# Patient Record
Sex: Female | Born: 2000 | Race: Black or African American | Hispanic: No | Marital: Single | Smoking: Never smoker
Health system: Southern US, Community
[De-identification: ages and names within clinical notes are randomized; demographics above are authoritative.]

## PROBLEM LIST (undated history)

## (undated) DIAGNOSIS — Z789 Other specified health status: Secondary | ICD-10-CM

## (undated) HISTORY — PX: NO PAST SURGERIES: SHX2092

## (undated) HISTORY — DX: Other specified health status: Z78.9

---

## 2020-06-06 ENCOUNTER — Encounter (HOSPITAL_COMMUNITY): Payer: Self-pay | Admitting: Emergency Medicine

## 2020-06-06 ENCOUNTER — Other Ambulatory Visit: Payer: Self-pay

## 2020-06-06 ENCOUNTER — Emergency Department (HOSPITAL_COMMUNITY)
Admission: EM | Admit: 2020-06-06 | Discharge: 2020-06-06 | Disposition: A | Discharge status: Home / Self Care | Payer: BC Managed Care – PPO | Attending: Emergency Medicine | Admitting: Emergency Medicine

## 2020-06-06 DIAGNOSIS — R059 Cough, unspecified: Secondary | ICD-10-CM | POA: Insufficient documentation

## 2020-06-06 DIAGNOSIS — J029 Acute pharyngitis, unspecified: Secondary | ICD-10-CM | POA: Diagnosis present

## 2020-06-06 DIAGNOSIS — J020 Streptococcal pharyngitis: Secondary | ICD-10-CM | POA: Diagnosis not present

## 2020-06-06 DIAGNOSIS — R1310 Dysphagia, unspecified: Secondary | ICD-10-CM | POA: Insufficient documentation

## 2020-06-06 DIAGNOSIS — Z20822 Contact with and (suspected) exposure to covid-19: Secondary | ICD-10-CM | POA: Diagnosis not present

## 2020-06-06 DIAGNOSIS — N939 Abnormal uterine and vaginal bleeding, unspecified: Secondary | ICD-10-CM | POA: Insufficient documentation

## 2020-06-06 LAB — RESP PANEL BY RT-PCR (FLU A&B, COVID) ARPGX2
Influenza A by PCR: NEGATIVE
Influenza B by PCR: NEGATIVE
SARS Coronavirus 2 by RT PCR: NEGATIVE

## 2020-06-06 LAB — I-STAT BETA HCG BLOOD, ED (MC, WL, AP ONLY): I-stat hCG, quantitative: <5 m[IU]/mL (ref <5)

## 2020-06-06 LAB — GROUP A STREP BY PCR: Group A Strep by PCR: DETECTED — AB

## 2020-06-06 MED ORDER — PENICILLIN G BENZATHINE 1200000 UNIT/2ML IM SUSP
1.2000 10*6.[IU] | Freq: Once | INTRAMUSCULAR | Status: AC
  Administered 2020-06-06: 1.2 10*6.[IU] via INTRAMUSCULAR
  Filled 2020-06-06: qty 2

## 2020-06-06 NOTE — ED Triage Notes (Signed)
Pt to ED with c/o sore throat and productive cough x's 2 days  Pt also c/o vag bleeding but st's she is not suppose to be on her period until next week

## 2020-06-06 NOTE — ED Provider Notes (Signed)
[facility] EMERGENCY DEPARTMENT Provider Note   CSN: [id number] Arrival date & time: 06/06/20  0020     History Chief Complaint  Patient presents with  . Sore Throat    Angelica Mills is a 19 y.o. female with no significant past medical history who presents the emergency department with a chief complaint of sore throat.  The patient reports that she developed a bilateral sore throat and productive cough with clear sputum 2 days ago.  She reports that she had a tactile fever 2 nights ago, but this is since resolved.  She is endorsing dysphagia, but denies drooling, muffled voice, neck or facial swelling, neck stiffness, headache, nausea, vomiting, diarrhea, abdominal pain.  She has been able to eat and drink at her baseline, but she does have some discomfort with swallowing.  She does also note that her last menstrual cycle ended on November 6, but she noticed a small amount of vaginal bleeding that was only present on the toilet tissue for the last couple of days.  She is unsure if she could be pregnant.  She denies vaginal discharge, pelvic pain, back pain, flank pain, dysuria, urinary frequency or hesitancy.  No treatment prior to arrival.  No known sick contacts.  She is fully vaccinated against COVID-19 and has also previously been infected with COVID-19.  She has no concerns for STIs.  The history is provided by the patient and medical records. No language interpreter was used.       History reviewed. No pertinent past medical history.  There are no problems to display for this patient.   History reviewed. No pertinent surgical history.   OB History   No obstetric history on file.     No family history on file.  Social History   Tobacco Use  . Smoking status: Never Smoker  . Smokeless tobacco: Never Used  Substance Use Topics  . Alcohol use: Yes    Comment: rare  . Drug use: Never    Home Medications Prior to Admission medications     Not on File    Allergies    Patient has no allergy information on record.  Review of Systems   Review of Systems  Constitutional: Positive for fever. Negative for activity change and chills.  HENT: Positive for sore throat and trouble swallowing. Negative for congestion, ear pain, hearing loss, sinus pressure, sinus pain and voice change.   Eyes: Negative for visual disturbance.  Respiratory: Negative for shortness of breath and wheezing.   Cardiovascular: Negative for chest pain and palpitations.  Gastrointestinal: Negative for abdominal pain, constipation, diarrhea, nausea and vomiting.  Genitourinary: Positive for vaginal bleeding. Negative for dysuria, frequency, pelvic pain, urgency, vaginal discharge and vaginal pain.  Musculoskeletal: Negative for back pain, myalgias, neck pain and neck stiffness.  Skin: Negative for rash.  Allergic/Immunologic: Negative for immunocompromised state.  Neurological: Negative for dizziness, seizures, syncope, weakness, numbness and headaches.  Psychiatric/Behavioral: Negative for confusion and dysphoric mood.    Physical Exam Updated Vital Signs BP 128/72 (BP Location: Right Arm)   Pulse 82   Temp 98.6 F (37 C) (Oral)   Resp 16   Ht 5\' 8"  (1.727 m)   Wt 77.6 kg   LMP 05/12/2020 (Exact Date)   SpO2 99%   BMI 26.00 kg/m   Physical Exam Vitals and nursing note reviewed.  Constitutional:      General: She is not in acute distress.    Appearance: She is not ill-appearing, toxic-appearing  or diaphoretic.  HENT:     Head: Normocephalic.     Nose: Nose normal. No congestion or rhinorrhea.     Mouth/Throat:     Mouth: Mucous membranes are moist.     Pharynx: Uvula midline. Posterior oropharyngeal erythema present. No pharyngeal swelling, oropharyngeal exudate or uvula swelling.     Tonsils: No tonsillar exudate or tonsillar abscesses.     Comments: Posterior oropharynx is erythematous.  Tonsils are 1+ bilaterally.  No exudate.  No  peritonsillar abscess.  Tolerating secretions without difficulty.  Phonation is normal.  Uvula is midline. No sublingual edema. Eyes:     General: No scleral icterus.    Conjunctiva/sclera: Conjunctivae normal.  Cardiovascular:     Rate and Rhythm: Normal rate and regular rhythm.     Pulses: Normal pulses.     Heart sounds: Normal heart sounds. No murmur heard.  No friction rub. No gallop.   Pulmonary:     Effort: Pulmonary effort is normal. No respiratory distress.     Breath sounds: No stridor. No wheezing, rhonchi or rales.  Chest:     Chest wall: No tenderness.  Abdominal:     General: There is no distension.     Palpations: Abdomen is soft.     Tenderness: There is no abdominal tenderness.  Musculoskeletal:     Cervical back: Neck supple.  Skin:    General: Skin is warm.     Findings: No rash.  Neurological:     Mental Status: She is alert.  Psychiatric:        Behavior: Behavior normal.     ED Results / Procedures / Treatments   Labs (all labs ordered are listed, but only abnormal results are displayed) Labs Reviewed  GROUP A STREP BY PCR - Abnormal; Notable for the following components:      Result Value   Group A Strep by PCR DETECTED (*)    All other components within normal limits  RESP PANEL BY RT-PCR (FLU A&B, COVID) ARPGX2  I-STAT BETA HCG BLOOD, ED ([facility], [facility], [facility] ONLY)    EKG None  Radiology No results found.  Procedures Procedures (including critical care time)  Medications Ordered in ED Medications  penicillin g benzathine (BICILLIN LA) 1200000 UNIT/2ML injection 1.2 Million Units (1.2 Million Units Intramuscular Given 06/06/20 0517)    ED Course  I have reviewed the triage vital signs and the nursing notes.  Pertinent labs & imaging results that were available during my care of the patient were reviewed by me and considered in my medical decision making (see chart for details).    MDM Rules/Calculators/A&P                           19 year old female who is otherwise healthy presenting with 2 days of sore throat, dysphagia, and productive cough with clear sputum.  She had a tactile fever initially, but this has since resolved.  On exam, there is erythema noted to the posterior oropharynx.  No exudates.  Tonsils are 1+ bilaterally.  I have low suspicion for retropharyngeal abscess or peritonsillar abscess.  Doubt Ludwig's angina.   Labs have been reviewed and independently interpreted by me.  Strep PCR is positive. Doubt COVID-19 or influenza as these test are negative. After shared decision-making conversation, patient is agreeable to IM penicillin G, which has been administered in the ER.  Home supportive care and infectious precautions have been discussed.   In regard to  vaginal bleeding, she has not been passing any clots.  She is having no signs or symptoms of symptomatic anemia.  She has only noticed a small amount of blood on toilet tissue.  Pregnancy test is negative.  Doubt ectopic pregnancy, ruptured ovarian cyst, miscarriage, PID. We discussed performing a pelvic exam in the ER, but she declined.  She will follow up with her OB/GYN if symptoms persist.  ER return precautions given.  She is hemodynamically stable no acute distress.  Safe discharge home with outpatient follow-up as indicated.  Final Clinical Impression(s) / ED Diagnoses Final diagnoses:  Streptococcal pharyngitis  Vaginal spotting    Rx / DC Orders ED Discharge Orders    None       Barkley Boards, PA-C 06/06/20 0708    Mesner, Barbara Cower, MD 06/06/20 [phone]

## 2020-06-06 NOTE — Discharge Instructions (Addendum)
Thank you for allowing me to care for you today in the Emergency Department.   You tested positive for strep throat today/streptococcal pharyngitis.  You were treated with an injection of penicillin.  You can remain contagious from strep throat for 1 to 2 days after treatment.  You should avoid sharing food utensils, drinking after anyone else.   Make sure that you are frequently washing her hands with warm water and soap.  Cover your mouth when you cough or sneeze.  If any close contacts develop similar symptoms, they should also seek testing.  Eating and drinking hot and cold beverages and food may be easier to swallow.  You can gargle warm salt water every 6 hours to help with your pain.  Take 650 mg of Tylenol or 600 mg of ibuprofen with food every 6 hours for pain.  You can alternate between these 2 medications every 3 hours if your pain returns.  For instance, you can take Tylenol at noon, followed by a dose of ibuprofen at 3, followed by second dose of Tylenol and 6.  Regarding vaginal bleeding, you can follow-up with your OB/GYN if you continue to have irregular vaginal bleeding.  You should return to the emergency department if you have vaginal bleeding after having a positive pregnancy test, if you have heavy vaginal bleeding or you are passing lots of clots, if you feel very short of breath, if you pass out, or develop other new, concerning symptoms

## 2020-07-07 NOTE — L&D Delivery Note (Signed)
[facility] Delivery Note  Angelica Mills is a 20 y.o. G1P0 s/p induced vag del at [redacted]w[redacted]d. She was admitted for IOL due to gHTN.   ROM: 5h 76m with clear fluid GBS Status: pos Maximum Maternal Temperature: 99.1  Labor Progress: Angelica Mills was admitted for IOL due to gHTN. She had cytotec dosing and a Cook's catheter place, followed by AROM and then progression to complete dilation. Her BPs were mildly elevated in labor and she had a neg P/C ratio.  Delivery Date/Time: February 26, 2021 at 1235pm Delivery: Called to room and patient was complete and pushing. Head delivered OA. No nuchal cord present. Shoulder and body delivered in usual fashion. Infant with spontaneous cry, placed on mother's abdomen, dried and stimulated. Cord clamped x 2 after 1-minute delay, and cut by FOB. Cord blood drawn. Placenta delivered spontaneously with gentle cord traction. Fundus firm with massage and Pitocin. Labia, perineum, vagina, and cervix inspected and found to have a L labial abrasion, not repaired.   Placenta: spont, intact; to L&D Complications: none Lacerations: none EBL: 100cc Analgesia: none  Postpartum Planning [x]  message to sent to schedule follow-up   Infant: girl  APGARs 9/9  2784g (6lb 2.2oz)  11/9, CNM  02/26/2021 1:25 PM

## 2020-08-22 ENCOUNTER — Ambulatory Visit (INDEPENDENT_AMBULATORY_CARE_PROVIDER_SITE_OTHER): Payer: BC Managed Care – PPO | Admitting: Obstetrics and Gynecology

## 2020-08-22 ENCOUNTER — Encounter: Payer: Self-pay | Admitting: Obstetrics and Gynecology

## 2020-08-22 ENCOUNTER — Other Ambulatory Visit (HOSPITAL_COMMUNITY)
Admission: RE | Admit: 2020-08-22 | Discharge: 2020-08-22 | Disposition: A | Discharge status: Home / Self Care | Payer: BC Managed Care – PPO | Source: Ambulatory Visit | Attending: Obstetrics and Gynecology | Admitting: Obstetrics and Gynecology

## 2020-08-22 ENCOUNTER — Other Ambulatory Visit: Payer: Self-pay

## 2020-08-22 VITALS — BP 117/78 | HR 98 | Temp 97.9°F | Wt 191.8 lb

## 2020-08-22 DIAGNOSIS — B9689 Other specified bacterial agents as the cause of diseases classified elsewhere: Secondary | ICD-10-CM | POA: Diagnosis not present

## 2020-08-22 DIAGNOSIS — O0990 Supervision of high risk pregnancy, unspecified, unspecified trimester: Secondary | ICD-10-CM | POA: Insufficient documentation

## 2020-08-22 DIAGNOSIS — Z3A11 11 weeks gestation of pregnancy: Secondary | ICD-10-CM | POA: Diagnosis not present

## 2020-08-22 DIAGNOSIS — Z3401 Encounter for supervision of normal first pregnancy, first trimester: Secondary | ICD-10-CM | POA: Diagnosis present

## 2020-08-22 DIAGNOSIS — Z3400 Encounter for supervision of normal first pregnancy, unspecified trimester: Secondary | ICD-10-CM | POA: Diagnosis present

## 2020-08-22 DIAGNOSIS — O23591 Infection of other part of genital tract in pregnancy, first trimester: Secondary | ICD-10-CM | POA: Diagnosis not present

## 2020-08-22 HISTORY — DX: Supervision of high risk pregnancy, unspecified, unspecified trimester: O09.90

## 2020-08-22 NOTE — Progress Notes (Addendum)
INITIAL OBSTETRICAL VISIT Patient name: Angelica Mills MRN [id number]  Date of birth: August 11, 2000 Chief Complaint:   Initial Prenatal Visit  History of Present Illness:   Angelica Mills is a 20 y.o. G1P0 African American female at [redacted]w[redacted]d by LMP with an Estimated Date of Delivery: 03/10/21 being seen today for her initial obstetrical visit.  Her obstetrical history is significant for none. This is a planned pregnancy. She and the father of the baby (FOB)/spouse "[name]" live together. She has a support system that consists of her spouseher mother/father/friends. Today she reports nausea and vomiting. She reports the N/V had stopped, but this morning she threw up her water and the nuts she tried to eat. She reports eating a vegetarian diet. She desires a low intervention birth/waterbirth.  Patient's last menstrual period was 06/03/2020. Last pap n/a. Results were: n/a Review of Systems:   Pertinent items are noted in HPI Denies cramping/contractions, leakage of fluid, vaginal bleeding, abnormal vaginal discharge w/ itching/odor/irritation, headaches, visual changes, shortness of breath, chest pain, abdominal pain, severe nausea/vomiting, or problems with urination or bowel movements unless otherwise stated above.  Pertinent History Reviewed:  Reviewed past medical,surgical, social, obstetrical and family history.  Reviewed problem list, medications and allergies. OB History  Gravida Para Term Preterm AB Living  1            SAB IAB Ectopic Multiple Live Births               # Outcome Date GA Lbr Len/2nd Weight Sex Delivery Anes PTL Lv  1 Current            Physical Assessment:   Vitals:   08/22/20 0922  BP: 117/78  Pulse: 98  Temp: 97.9 F (36.6 C)  Weight: 191 lb 12.8 oz (87 kg)  Body mass index is 29.16 kg/m.       Physical Examination:  General appearance - well appearing, and in no distress  Mental status - alert, oriented to person, place, and time  Psych:  She  has a normal mood and affect  Skin - warm and dry, normal color, no suspicious lesions noted  Chest - effort normal, all lung fields clear to auscultation bilaterally  Heart - normal rate and regular rhythm  Abdomen - soft, nontender  Extremities:  No swelling or varicosities noted  Pelvic - VULVA: normal appearing vulva with no masses, tenderness or lesions  VAGINA: normal appearing vagina with normal color and discharge, no lesions.   CERVIX: normal appearing cervix without discharge or lesions, no CMT  Thin prep pap is not done   FHTs by doppler: 160 bpm  Assessment & Plan:  1) Low-Risk Pregnancy G1P0 at [redacted]w[redacted]d with an Estimated Date of Delivery: 03/10/21   2) Initial OB visit - Welcomed to practice and introduced self to patient in addition to discussing other advanced practice providers that she may be seeing at this practice - Congratulated patient - Anticipatory guidance on upcoming appointments - Educated on COVID19 and pregnancy and the integration of virtual appointments  - Educated on babyscripts app- patient reports she has not received email, encouraged to look in spam folder and to call office if she still has not received email - patient verbalizes understanding    3) Supervision of normal first pregnancy, antepartum - CBC/D/Plt+RPR+Rh+ABO+Rub Ab... - Culture, OB Urine - Cervicovaginal ancillary only( [place]) - Genetic Screening - Hemoglobin A1c - Information provided on 1st and 2nd trimesters and waterbirth - Anatomy U/S @  18 wks    Meds: No orders of the defined types were placed in this encounter.   Initial labs obtained Continue prenatal vitamins Reviewed n/v relief measures and warning s/s to report Reviewed recommended weight gain based on pre-gravid BMI Encouraged well-balanced diet Genetic Screening discussed: ordered Cystic fibrosis, SMA, Fragile X screening discussed ordered The nature of [facility] with multiple  MDs and other Advanced Practice Providers was explained to patient; also emphasized that residents, students are part of our team.  Discussed optimized OB schedule and video visits. Advised can have an in-office visit whenever she feels she needs to be seen.  Does not have own BP cuff. BP cuff given today. Explained to patient that BP will be mailed to her house. Check BP weekly, let us know if >140/90. Advised to call during normal business hours and there is an after-hours nurse line available.    Follow-up: Return in about 6 weeks (around 10/03/2020) for Return OB - My Chart video.   Orders Placed This Encounter  Procedures  . Culture, OB Urine  . [name] MFM OB COMP + 14 WK  . CBC/D/Plt+RPR+Rh+ABO+Rub Ab...  . Genetic Screening  . Hemoglobin A1c    [name], CNM 08/22/2020

## 2020-08-22 NOTE — Patient Instructions (Addendum)
First Trimester of Pregnancy  The first trimester of pregnancy starts on the first day of your last menstrual period until the end of week 12. This is months 1 through 3 of pregnancy. A week after a sperm fertilizes an egg, the egg will implant into the wall of the uterus and begin to develop into a baby. By the end of 12 weeks, all the baby's organs will be formed and the baby will be 2-3 inches in size. Body changes during your first trimester Your body goes through many changes during pregnancy. The changes vary and generally return to normal after your baby is born. Physical changes  You may gain or lose weight.  Your breasts may begin to grow larger and become tender. The tissue that surrounds your nipples (areola) may become darker.  Dark spots or blotches (chloasma or mask of pregnancy) may develop on your face.  You may have changes in your hair. These can include thickening or thinning of your hair or changes in texture. Health changes  You may feel nauseous, and you may vomit.  You may have heartburn.  You may develop headaches.  You may develop constipation.  Your gums may bleed and may be sensitive to brushing and flossing. Other changes  You may tire easily.  You may urinate more often.  Your menstrual periods will stop.  You may have a loss of appetite.  You may develop cravings for certain kinds of food.  You may have changes in your emotions from day to day.  You may have more vivid and strange dreams. Follow these instructions at home: Medicines  Follow your health care provider's instructions regarding medicine use. Specific medicines may be either safe or unsafe to take during pregnancy. Do not take any medicines unless told to by your health care provider.  Take a prenatal vitamin that contains at least 600 micrograms (mcg) of folic acid. Eating and drinking  Eat a healthy diet that includes fresh fruits and vegetables, whole grains, good sources of  protein such as meat, eggs, or tofu, and low-fat dairy products.  Avoid raw meat and unpasteurized juice, milk, and cheese. These carry germs that can harm you and your baby.  If you feel nauseous or you vomit: ? Eat 4 or 5 small meals a day instead of 3 large meals. ? Try eating a few soda crackers. ? Drink liquids between meals instead of during meals.  You may need to take these actions to prevent or treat constipation: ? Drink enough fluid to keep your urine pale yellow. ? Eat foods that are high in fiber, such as beans, whole grains, and fresh fruits and vegetables. ? Limit foods that are high in fat and processed sugars, such as fried or sweet foods. Activity  Exercise only as directed by your health care provider. Most people can continue their usual exercise routine during pregnancy. Try to exercise for 30 minutes at least 5 days a week.  Stop exercising if you develop pain or cramping in the lower abdomen or lower back.  Avoid exercising if it is very hot or humid or if you are at high altitude.  Avoid heavy lifting.  If you choose to, you may have sex unless your health care provider tells you not to. Relieving pain and discomfort  Wear a good support bra to relieve breast tenderness.  Rest with your legs elevated if you have leg cramps or low back pain.  If you develop bulging veins (varicose veins) in   your legs: ? Wear support hose as told by your health care provider. ? Elevate your feet for 15 minutes, 3-4 times a day. ? Limit salt in your diet. Safety  Wear your seat belt at all times when driving or riding in a car.  Talk with your health care provider if someone is verbally or physically abusive to you.  Talk with your health care provider if you are feeling sad or have thoughts of hurting yourself. Lifestyle  Do not use hot tubs, steam rooms, or saunas.  Do not douche. Do not use tampons or scented sanitary pads.  Do not use herbal remedies, alcohol,  illegal drugs, or medicines that are not approved by your health care provider. Chemicals in these products can harm your baby.  Do not use any products that contain nicotine or tobacco, such as cigarettes, e-cigarettes, and chewing tobacco. If you need help quitting, ask your health care provider.  Avoid cat litter boxes and soil used by cats. These carry germs that can cause birth defects in the baby and possibly loss of the unborn baby (fetus) by miscarriage or stillbirth. General instructions  During routine prenatal visits in the first trimester, your health care provider will do a physical exam, perform necessary tests, and ask you how things are going. Keep all follow-up visits. This is important.  Ask for help if you have counseling or nutritional needs during pregnancy. Your health care provider can offer advice or refer you to specialists for help with various needs.  Schedule a dentist appointment. At home, brush your teeth with a soft toothbrush. Floss gently.  Write down your questions. Take them to your prenatal visits. Where to find more information  American Pregnancy Association: americanpregnancy.org  [employer] of [employer]st[employer]ians and Gynecologists: https://www.todd-brady.net/  Office on [employer] Health: MightyReward.co.nz Contact a health care provider if you have:  Dizziness.  A fever.  Mild pelvic cramps, pelvic pressure, or nagging pain in the abdominal area.  Nausea, vomiting, or diarrhea that lasts for 24 hours or longer.  A bad-smelling vaginal discharge.  Pain when you urinate.  Known exposure to a contagious illness, such as chickenpox, measles, Zika virus, HIV, or hepatitis. Get help right away if you have:  Spotting or bleeding from your vagina.  Severe abdominal cramping or pain.  Shortness of breath or chest pain.  Any kind of trauma, such as from a fall or a car crash.  New or increased pain, swelling, or redness in an  arm or leg. Summary  The first trimester of pregnancy starts on the first day of your last menstrual period until the end of week 12 (months 1 through 3).  Eating 4 or 5 small meals a day rather than 3 large meals may help to relieve nausea and vomiting.  Do not use any products that contain nicotine or tobacco, such as cigarettes, e-cigarettes, and chewing tobacco. If you need help quitting, ask your health care provider.  Keep all follow-up visits. This is important. This information is not intended to replace advice given to you by your health care provider. Make sure you discuss any questions you have with your health care provider. Document Revised: 11/30/2019 Document Reviewed: 10/06/2019 Elsevier Patient Education  2021 Elsevier Inc.   Second Trimester of Pregnancy  The second trimester of pregnancy is from week 13 through week 27. This is months 4 through 6 of pregnancy. The second trimester is often a time when you feel your best. Your body has adjusted to  being pregnant, and you begin to feel better physically. During the second trimester:  Morning sickness has lessened or stopped completely.  You may have more energy.  You may have an increase in appetite. The second trimester is also a time when the unborn baby (fetus) is growing rapidly. At the end of the sixth month, the fetus may be up to 12 inches long and weigh about 1 pounds. You will likely begin to feel the baby move (quickening) between 16 and 20 weeks of pregnancy. Body changes during your second trimester Your body continues to go through many changes during your second trimester. The changes vary and generally return to normal after the baby is born. Physical changes  Your weight will continue to increase. You will notice your lower abdomen bulging out.  You may begin to get stretch marks on your hips, abdomen, and breasts.  Your breasts will continue to grow and to become tender.  Dark spots or blotches  (chloasma or mask of pregnancy) may develop on your face.  A dark line from your belly button to the pubic area (linea nigra) may appear.  You may have changes in your hair. These can include thickening of your hair, rapid growth, and changes in texture. Some people also have hair loss during or after pregnancy, or hair that feels dry or thin. Health changes  You may develop headaches.  You may have heartburn.  You may develop constipation.  You may develop hemorrhoids or swollen, bulging veins (varicose veins).  Your gums may bleed and may be sensitive to brushing and flossing.  You may urinate more often because the fetus is pressing on your bladder.  You may have back pain. This is caused by: ? Weight gain. ? Pregnancy hormones that are relaxing the joints in your pelvis. ? A shift in weight and the muscles that support your balance. Follow these instructions at home: Medicines  Follow your health care provider's instructions regarding medicine use. Specific medicines may be either safe or unsafe to take during pregnancy. Do not take any medicines unless approved by your health care provider.  Take a prenatal vitamin that contains at least 600 micrograms (mcg) of folic acid. Eating and drinking  Eat a healthy diet that includes fresh fruits and vegetables, whole grains, good sources of protein such as meat, eggs, or tofu, and low-fat dairy products.  Avoid raw meat and unpasteurized juice, milk, and cheese. These carry germs that can harm you and your baby.  You may need to take these actions to prevent or treat constipation: ? Drink enough fluid to keep your urine pale yellow. ? Eat foods that are high in fiber, such as beans, whole grains, and fresh fruits and vegetables. ? Limit foods that are high in fat and processed sugars, such as fried or sweet foods. Activity  Exercise only as directed by your health care provider. Most people can continue their usual exercise  routine during pregnancy. Try to exercise for 30 minutes at least 5 days a week. Stop exercising if you develop contractions in your uterus.  Stop exercising if you develop pain or cramping in the lower abdomen or lower back.  Avoid exercising if it is very hot or humid or if you are at a high altitude.  Avoid heavy lifting.  If you choose to, you may have sex unless your health care provider tells you not to. Relieving pain and discomfort  Wear a supportive bra to prevent discomfort from breast tenderness.  Take warm sitz baths to soothe any pain or discomfort caused by hemorrhoids. Use hemorrhoid cream if your health care provider approves.  Rest with your legs raised (elevated) if you have leg cramps or low back pain.  If you develop varicose veins: ? Wear support hose as told by your health care provider. ? Elevate your feet for 15 minutes, 3-4 times a day. ? Limit salt in your diet. Safety  Wear your seat belt at all times when driving or riding in a car.  Talk with your health care provider if someone is verbally or physically abusive to you. Lifestyle  Do not use hot tubs, steam rooms, or saunas.  Do not douche. Do not use tampons or scented sanitary pads.  Avoid cat litter boxes and soil used by cats. These carry germs that can cause birth defects in the baby and possibly loss of the fetus by miscarriage or stillbirth.  Do not use herbal remedies, alcohol, illegal drugs, or medicines that are not approved by your health care provider. Chemicals in these products can harm your baby.  Do not use any products that contain nicotine or tobacco, such as cigarettes, e-cigarettes, and chewing tobacco. If you need help quitting, ask your health care provider. General instructions  During a routine prenatal visit, your health care provider will do a physical exam and other tests. He or she will also discuss your overall health. Keep all follow-up visits. This is important.  Ask  your health care provider for a referral to a local prenatal education class.  Ask for help if you have counseling or nutritional needs during pregnancy. Your health care provider can offer advice or refer you to specialists for help with various needs. Where to find more information  American Pregnancy Association: americanpregnancy.org  [employer]stetricians and Gynecologists: https://www.todd-brady.net/  Office on [employer] Health: MightyReward.co.nz Contact a health care provider if you have:  A headache that does not go away when you take medicine.  Vision changes or you see spots in front of your eyes.  Mild pelvic cramps, pelvic pressure, or nagging pain in the abdominal area.  Persistent nausea, vomiting, or diarrhea.  A bad-smelling vaginal discharge or foul-smelling urine.  Pain when you urinate.  Sudden or extreme swelling of your face, hands, ankles, feet, or legs.  A fever. Get help right away if you:  Have fluid leaking from your vagina.  Have spotting or bleeding from your vagina.  Have severe abdominal cramping or pain.  Have difficulty breathing.  Have chest pain.  Have fainting spells.  Have not felt your baby move for the time period told by your health care provider.  Have new or increased pain, swelling, or redness in an arm or leg. Summary  The second trimester of pregnancy is from week 13 through week 27 (months 4 through 6).  Do not use herbal remedies, alcohol, illegal drugs, or medicines that are not approved by your health care provider. Chemicals in these products can harm your baby.  Exercise only as directed by your health care provider. Most people can continue their usual exercise routine during pregnancy.  Keep all follow-up visits. This is important. This information is not intended to replace advice given to you by your health care provider. Make sure you discuss any questions you have with your health  care provider. Document Revised: 11/30/2019 Document Reviewed: 10/06/2019 Elsevier Patient Education  2021 [employer].   Considering [place]? Guide for patients at [facility] [employer] [facility] (  [facility]) Why consider waterbirth? . Gentle birth for babies  . Less pain medicine used in labor  . May allow for passive descent/less pushing  . May reduce perineal tears  . More mobility and instinctive maternal position changes  . Increased maternal relaxation   Is waterbirth safe? What are the risks of infection, drowning or other complications? . Infection:  Marland Kitchen. Very low risk (3.7 % for tub vs 4.8% for bed)  . 7 in 8000 waterbirths with documented infection  . Poorly cleaned equipment most common cause  . Slightly lower group B strep transmission rate  . Drowning  . Maternal:  . Very low risk  . Related to seizures or fainting  . Newborn:  Marland Kitchen. Very low risk. No evidence of increased risk of respiratory problems in multiple large studies  . Physiological protection from breathing under water  . Avoid underwater birth if there are any fetal complications  . Once baby's head is out of the water, keep it out.  . Birth complication  . Some reports of cord trauma, but risk decreased by bringing baby to surface gradually  . No evidence of increased risk of shoulder dystocia. Mothers can usually change positions faster in water than in a bed, possibly aiding the maneuvers to free the shoulder.   There are 2 things you MUST do to have a waterbirth with [facility]: 1. Attend a [facility] class [facility]   a. 3rd Wednesday of every month from 7-9 pm (virtual during COVID) b. Free c. Register by calling [phone] or register online at [email or web address] d. Bring us the certificate from the class to your prenatal appointment or send via MyChart 2. Meet with a midwife at 36 weeks* to see if you can still plan a waterbirth and to sign the consent.   *We also  recommend that you schedule as many of your prenatal visits with a midwife as possible.    Helpful information: . You may want to bring a bathing suit top to the hospital to wear during labor but this is optional.  All other supplies are provided by the hospital. . Please arrive at the hospital with signs of active labor, and do not wait at home until late in labor. It takes 45 min- 2 hours for COVID testing, fetal monitoring, and check in to your room to take place, plus transport and filling of the waterbirth tub.    Things that would prevent you from having a waterbirth: . Unknown or Positive COVID-19 diagnosis upon admission to hospital* . Premature, <37wks  . Previous cesarean birth  . Presence of thick meconium-stained fluid  . Multiple gestation (Twins, triplets, etc.)  . Uncontrolled diabetes or gestational diabetes requiring medication  . Hypertension diagnosed in pregnancy or preexisting hypertension (gestational hypertension, preeclampsia, or chronic hypertension) . Heavy vaginal bleeding  . Non-reassuring fetal heart rate  . Active infection (MRSA, etc.). Group B Strep is NOT a contraindication for waterbirth.  . If your labor has to be induced and induction method requires continuous monitoring of the baby's heart rate  . Other risks/issues identified by your obstetrical provider   Please remember that birth is unpredictable. Under certain unforeseeable circumstances your provider may advise against giving birth in the tub. These decisions will be made on a case-by-case basis and with the safety of you and your baby as our highest priority.   *Please remember that in order to have a waterbirth, you must test Negative to COVID-19  upon admission to the hospital.  Updated 05/22/2020

## 2020-08-23 LAB — CBC/D/PLT+RPR+RH+ABO+RUB AB...
Antibody Screen: NEGATIVE
Basophils Absolute: 0 10*3/uL (ref 0.0–0.2)
Basos: 0 %
EOS (ABSOLUTE): 0 10*3/uL (ref 0.0–0.4)
Eos: 0 %
HCV Ab: <0.1 s/co ratio (ref 0.0–0.9)
HIV Screen 4th Generation wRfx: NONREACTIVE
Hematocrit: 38.6 % (ref 34.0–46.6)
Hemoglobin: 12.9 g/dL (ref 11.1–15.9)
Hepatitis B Surface Ag: NEGATIVE
Immature Grans (Abs): 0 10*3/uL (ref 0.0–0.1)
Immature Granulocytes: 0 %
Lymphocytes Absolute: 1.8 10*3/uL (ref 0.7–3.1)
Lymphs: 25 %
MCH: 28.4 pg (ref 26.6–33.0)
MCHC: 33.4 g/dL (ref 31.5–35.7)
MCV: 85 fL (ref 79–97)
Monocytes Absolute: 0.3 10*3/uL (ref 0.1–0.9)
Monocytes: 5 %
Neutrophils Absolute: 5 10*3/uL (ref 1.4–7.0)
Neutrophils: 70 %
Platelets: 345 10*3/uL (ref 150–450)
RBC: 4.55 x10E6/uL (ref 3.77–5.28)
RDW: 13.3 % (ref 11.7–15.4)
RPR Ser Ql: NONREACTIVE
Rh Factor: POSITIVE
Rubella Antibodies, IGG: 1.31 index (ref >0.99)
WBC: 7.3 10*3/uL (ref 3.4–10.8)

## 2020-08-23 LAB — HEMOGLOBIN A1C
Est. average glucose Bld gHb Est-mCnc: 100 mg/dL
Hgb A1c MFr Bld: 5.1 % (ref 4.8–5.6)

## 2020-08-23 LAB — HCV INTERPRETATION

## 2020-08-24 LAB — CERVICOVAGINAL ANCILLARY ONLY
Bacterial Vaginitis (gardnerella): POSITIVE — AB
Candida Glabrata: NEGATIVE
Candida Vaginitis: NEGATIVE
Chlamydia: NEGATIVE
Comment: NEGATIVE
Comment: NEGATIVE
Comment: NEGATIVE
Comment: NEGATIVE
Comment: NEGATIVE
Comment: NORMAL
Neisseria Gonorrhea: NEGATIVE
Trichomonas: NEGATIVE

## 2020-08-24 LAB — URINE CULTURE, OB REFLEX: Organism ID, Bacteria: NO GROWTH

## 2020-08-24 LAB — CULTURE, OB URINE

## 2020-08-25 ENCOUNTER — Other Ambulatory Visit: Payer: Self-pay | Admitting: Obstetrics and Gynecology

## 2020-08-25 DIAGNOSIS — B9689 Other specified bacterial agents as the cause of diseases classified elsewhere: Secondary | ICD-10-CM

## 2020-08-25 MED ORDER — METRONIDAZOLE 500 MG PO TABS: 500.0000 mg | ORAL_TABLET | Freq: Two times a day (BID) | ORAL | 0 refills | Status: DC

## 2020-08-27 ENCOUNTER — Encounter: Payer: Self-pay | Admitting: *Deleted

## 2020-09-03 ENCOUNTER — Encounter: Payer: Self-pay | Admitting: *Deleted

## 2020-09-24 ENCOUNTER — Encounter: Payer: Self-pay | Admitting: *Deleted

## 2020-10-04 ENCOUNTER — Telehealth (INDEPENDENT_AMBULATORY_CARE_PROVIDER_SITE_OTHER): Payer: BC Managed Care – PPO | Admitting: Obstetrics and Gynecology

## 2020-10-04 ENCOUNTER — Encounter: Payer: Self-pay | Admitting: Obstetrics and Gynecology

## 2020-10-04 ENCOUNTER — Other Ambulatory Visit: Payer: Self-pay

## 2020-10-04 VITALS — BP 117/65 | HR 87 | Wt 194.0 lb

## 2020-10-04 DIAGNOSIS — Z3A17 17 weeks gestation of pregnancy: Secondary | ICD-10-CM | POA: Diagnosis not present

## 2020-10-04 DIAGNOSIS — Z3402 Encounter for supervision of normal first pregnancy, second trimester: Secondary | ICD-10-CM

## 2020-10-04 DIAGNOSIS — Z3400 Encounter for supervision of normal first pregnancy, unspecified trimester: Secondary | ICD-10-CM

## 2020-10-04 NOTE — Addendum Note (Signed)
Addended by: Kenard Gower on: 10/04/2020 03:08 PM   Modules accepted: Level of Service

## 2020-10-04 NOTE — Progress Notes (Signed)
   MY CHART VIDEO VIRTUAL OBSTETRICS VISIT ENCOUNTER NOTE  I connected with Angelica Mills on 10/04/20 at  9:10 AM EDT by My Chart video at home and verified that I am speaking with the correct person using two identifiers. Provider located at [employer] [facility] [employer] at [place].   I discussed the limitations, risks, security and privacy concerns of performing an evaluation and management service by My Chart video and the availability of in person appointments. I also discussed with the patient that there may be a patient responsible charge related to this service. The patient expressed understanding and agreed to proceed.  Subjective:  Angelica Mills is a 20 y.o. G1P0 at [redacted]w[redacted]d being followed for ongoing prenatal care.  She is currently monitored for the following issues for this low-risk pregnancy and has Supervision of normal first pregnancy, antepartum on their problem list.  Patient reports no complaints. Reports fetal movement. Denies any contractions, bleeding or leaking of fluid.   The following portions of the patient's history were reviewed and updated as appropriate: allergies, current medications, past family history, past medical history, past social history, past surgical history and problem list.   Objective:   General:  Alert, oriented and cooperative.   Mental Status: Normal mood and affect perceived. Normal judgment and thought content.  Rest of physical exam deferred due to type of encounter  BP 117/65   Pulse 87   Wt 194 lb (88 kg)   LMP 06/03/2020   BMI 29.50 kg/m  **Done by patient's own at home BP cuff and scale  Assessment and Plan:  Pregnancy: G1P0 at [redacted]w[redacted]d  1. Supervision of normal first pregnancy, antepartum - Demonstrated over MyChart video how to properly use BP cuff. Patient was able to return demonstration and expressed understanding of its use. - Anatomy U/S scheduled for 11/13/20 - Anticipatory guidance for MyChart visit in 6  weeks  2. [redacted] weeks gestation of pregnancy    Preterm labor symptoms and general obstetric precautions including but not limited to vaginal bleeding, contractions, leaking of fluid and fetal movement were reviewed in detail with the patient.  I discussed the assessment and treatment plan with the patient. The patient was provided an opportunity to ask questions and all were answered. The patient agreed with the plan and demonstrated an understanding of the instructions. The patient was advised to call back or seek an in-person office evaluation/go to MAU at [facility] for any urgent or concerning symptoms. Please refer to After Visit Summary for other counseling recommendations.   I provided 10 minutes of non-face-to-face time during this encounter. There was 5 minutes of chart review time spent prior to this encounter. Total time spent = 15 minutes.  Return in about 6 weeks (around 11/15/2020) for Return OB - My Chart video.  Future Appointments  Date Time Provider Department Center  11/13/2020  9:30 AM [facility] NURSE [facility]  11/13/2020  9:45 AM [facility] US5 [facility]    Raelyn Mora, CNM [facility] [employer], [facility]

## 2020-11-11 ENCOUNTER — Inpatient Hospital Stay (HOSPITAL_COMMUNITY)
Admission: AD | Admit: 2020-11-11 | Discharge: 2020-11-11 | Disposition: A | Discharge status: Home / Self Care | Payer: BC Managed Care – PPO | Attending: Obstetrics & Gynecology | Admitting: Obstetrics & Gynecology

## 2020-11-11 ENCOUNTER — Encounter (HOSPITAL_COMMUNITY): Payer: Self-pay | Admitting: Emergency Medicine

## 2020-11-11 ENCOUNTER — Inpatient Hospital Stay (HOSPITAL_BASED_OUTPATIENT_CLINIC_OR_DEPARTMENT_OTHER): Payer: BC Managed Care – PPO

## 2020-11-11 ENCOUNTER — Other Ambulatory Visit: Payer: Self-pay

## 2020-11-11 DIAGNOSIS — O23593 Infection of other part of genital tract in pregnancy, third trimester: Secondary | ICD-10-CM | POA: Diagnosis not present

## 2020-11-11 DIAGNOSIS — B9689 Other specified bacterial agents as the cause of diseases classified elsewhere: Secondary | ICD-10-CM | POA: Diagnosis not present

## 2020-11-11 DIAGNOSIS — O4692 Antepartum hemorrhage, unspecified, second trimester: Secondary | ICD-10-CM | POA: Diagnosis not present

## 2020-11-11 DIAGNOSIS — Z3A23 23 weeks gestation of pregnancy: Secondary | ICD-10-CM | POA: Diagnosis not present

## 2020-11-11 DIAGNOSIS — O23592 Infection of other part of genital tract in pregnancy, second trimester: Secondary | ICD-10-CM | POA: Diagnosis not present

## 2020-11-11 DIAGNOSIS — N760 Acute vaginitis: Secondary | ICD-10-CM | POA: Diagnosis not present

## 2020-11-11 LAB — URINALYSIS, ROUTINE W REFLEX MICROSCOPIC
Bacteria, UA: NONE SEEN
Bilirubin Urine: NEGATIVE
Glucose, UA: 150 mg/dL — AB
Ketones, ur: NEGATIVE mg/dL
Leukocytes,Ua: NEGATIVE
Nitrite: NEGATIVE
Protein, ur: NEGATIVE mg/dL
Specific Gravity, Urine: 1.009 (ref 1.005–1.030)
pH: 7 (ref 5.0–8.0)

## 2020-11-11 LAB — WET PREP, GENITAL
Sperm: NONE SEEN
Trich, Wet Prep: NONE SEEN
Yeast Wet Prep HPF POC: NONE SEEN

## 2020-11-11 MED ORDER — METRONIDAZOLE 500 MG PO TABS: 500.0000 mg | ORAL_TABLET | Freq: Two times a day (BID) | ORAL | 0 refills | Status: DC

## 2020-11-11 NOTE — ED Provider Notes (Signed)
Emergency Medicine Provider OB Triage Evaluation Note  Angelica Mills is a 20 y.o. female, G1P0, at [redacted]w[redacted]d gestation who presents to the emergency department with complaints of vaginal bleeding x 1 day. Just prior to arrival she felt like she needed to have a bowl movement, she did not however noticed bright red blood on the toilet paper and in the toilet. Has had intermittent abdominal cramping. She admits to dizziness that started this AM. Last intercourse x 2 days ago.   Review of  Systems  Positive: vaginal bleeding, abdominal pain,  Negative:  Back pain, fever, chills  Physical Exam  BP 124/68   Pulse 92   Temp 98.9 F (37.2 C) (Oral)   Resp 18   LMP 06/03/2020   SpO2 100%  General: Awake, no distress  HEENT: Atraumatic  Resp: Normal effort  Cardiac: Normal rate Abd: gravid abdomen, nontender  MSK: Moves all extremities without difficulty Neuro: Speech clear  Medical Decision Making  Pt evaluated for pregnancy concern and is stable for transfer to MAU. Pt is in agreement with plan for transfer.  7:57 PM Discussed with MAU APP, Rayfield Citizen, who accepts patient in transfer.  Clinical Impression  Patient hypertensive in triage. Work up deferred to MAU. Patient transported immediately after my exam.     Shanon Ace, PA-C 11/11/20 2313    Mancel Bale, MD 11/12/20 [phone]

## 2020-11-11 NOTE — MAU Note (Signed)
Pt sated she started haveng bleeding like a period about and hour ago and cramping on her side.

## 2020-11-11 NOTE — ED Triage Notes (Signed)
Patient is [redacted] weeks pregnant G1P0, reports vaginal bleeding this evening with abdominal cramping .

## 2020-11-11 NOTE — Discharge Instructions (Signed)
Bacterial Vaginosis  Bacterial vaginosis is an infection of the vagina. It happens when too many normal germs (healthy bacteria) grow in the vagina. This infection can make it easier to get other infections from sex (STIs). It is very important for pregnant women to get treated. This infection can cause babies to be born early or at a low birth weight. What are the causes? This infection is caused by an increase in certain germs that grow in the vagina. You cannot get this infection from toilet seats, bedsheets, swimming pools, or things that touch your vagina. What increases the risk?  Having sex with a new person or more than one person.  Having sex without protection.  Douching.  Having an intrauterine device (IUD).  Smoking.  Using drugs or drinking alcohol. These can lead you to do things that are risky.  Taking certain antibiotic medicines.  Being pregnant. What are the signs or symptoms? Some women have no symptoms. Symptoms may include:  A discharge from your vagina. It may be gray or white. It can be watery or foamy.  A fishy smell. This can happen after sex or during your menstrual period.  Itching in and around your vagina.  A feeling of burning or pain when you pee (urinate). How is this treated? This infection is treated with antibiotic medicines. These may be given to you as:  A pill.  A cream for your vagina.  A medicine that you put into your vagina (suppository). If the infection comes back after treatment, you may need more antibiotics. Follow these instructions at home: Medicines  Take over-the-counter and prescription medicines as told by your doctor.  Take or use your antibiotic medicine as told by your doctor. Do not stop taking or using it, even if you start to feel better. General instructions  If the person you have sex with is a woman, tell her that you have this infection. She will need to follow up with her doctor. If you have a female  partner, he does not need to be treated.  Do not have sex until you finish treatment.  Drink enough fluid to keep your pee pale yellow.  Keep your vagina and butt clean. ? Wash the area with warm water each day. ? Wipe from front to back after you use the toilet.  If you are breastfeeding a baby, ask your doctor if you should keep doing so during treatment.  Keep all follow-up visits. How is this prevented? Self-care  Do not douche.  Use only warm water to wash around your vagina.  Wear underwear that is cotton or lined with cotton.  Do not wear tight pants and pantyhose, especially in the summer. Safe sex  Use protection when you have sex. This includes: ? Use condoms. ? Use dental dams. This is a thin layer that protects the mouth during oral sex.  Limit how many people you have sex with. To prevent this infection, it is best to have sex with just one person.  Get tested for STIs. The person you have sex with should also get tested. Drugs and alcohol  Do not smoke or use any products that contain nicotine or tobacco. If you need help quitting, ask your doctor.  Do not use drugs.  Do not drink alcohol if: ? Your doctor tells you not to drink. ? You are pregnant, may be pregnant, or are planning to become pregnant.  If you drink alcohol: ? Limit how much you have to 0-1 drink   a day. ? Know how much alcohol is in your drink. In the [place] one drink equals one 12 oz bottle of beer (355 mL), one 5 oz glass of wine (148 mL), or one 1 oz glass of hard liquor (44 mL). Where to find more information  Centers for Disease Control and Prevention: www.cdc.gov  American Sexual Health Association: www.ashastd.org  Office on Women's Health: www.womenshealth.gov Contact a doctor if:  Your symptoms do not get better, even after you are treated.  You have more discharge or pain when you pee.  You have a fever or chills.  You have pain in your belly (abdomen) or in the area  between your hips.  You have pain with sex.  You bleed from your vagina between menstrual periods. Summary  This infection can happen when too many germs (bacteria) grow in the vagina.  This infection can make it easier to get infections from sex (STIs). Treating this can lower that chance.  Get treated if you are pregnant. This infection can cause babies to be born early.  Do not stop taking or using your antibiotic medicine, even if you start to feel better. This information is not intended to replace advice given to you by your health care provider. Make sure you discuss any questions you have with your health care provider. Document Revised: 12/22/2019 Document Reviewed: 12/22/2019 Elsevier Patient Education  2021 Elsevier Inc.  

## 2020-11-11 NOTE — MAU Provider Note (Signed)
Patient Angelica Mills is a 20 y.o. G1P0  at [redacted]w[redacted]d here with complaints of vaginal bleeding that occurred at 1900. She also noticed some abdominal pain around 5 pm. She felt the pain on her sides. She denies contractions, LOF.    She denies any history of diabetes, high blood pressure, surgeries in the past. She thinks she has BV. She notices a distinct smell. She has not had any treatment for BV. She last had intercourse two days ago.   She denies contraction. She feels a little light headed now. This morning she ate cereal, and chipotle for lunch. Bean burger in the afternoon at 5 pm. Two pieces of cake at 1820. She didn't have her protein shakes and bars today; she is a vegetarian.   She has not had her anatomy scan yet; it is scheduled for May 18. She does not know if she has a placenta previa or not.   She is very anxious and tearful.  Thinks she may also have a draining Bartholins cyst.  History     CSN: [id number]  Arrival date and time: 11/11/20 1939   None     Chief Complaint  Patient presents with  . [redacted] Weeks pregnant /Vaginal Bleeding with Cramping   Vaginal Bleeding The patient's primary symptoms include vaginal bleeding. This is a new problem. The problem has been rapidly improving. Associated symptoms include abdominal pain and constipation. Pertinent negatives include no diarrhea, dysuria, fever, urgency or vomiting. The vaginal discharge was bloody. The vaginal bleeding is spotting. She has not been passing clots. She has not been passing tissue.  Abdominal Pain This is a new problem. Pain location: on the sides. The pain is at a severity of 3/10 (It was a 9 on the way here). Associated symptoms include constipation. Pertinent negatives include no diarrhea, dysuria, fever or vomiting.    OB History    Gravida  1   Para      Term      Preterm      AB      Living        SAB      IAB      Ectopic      Multiple      Live Births               Past Medical History:  Diagnosis Date  . Medical history non-contributory     Past Surgical History:  Procedure Laterality Date  . NO PAST SURGERIES      Family History  Problem Relation Age of Onset  . Hypertension Father     Social History   Tobacco Use  . Smoking status: Never Smoker  . Smokeless tobacco: Never Used  Vaping Use  . Vaping Use: Never used  Substance Use Topics  . Alcohol use: Not Currently    Comment: rare  . Drug use: Never    Allergies: No Known Allergies  No medications prior to admission.    Review of Systems  Constitutional: Negative for fever.  Gastrointestinal: Positive for abdominal pain and constipation. Negative for diarrhea and vomiting.  Genitourinary: Positive for vaginal bleeding. Negative for dysuria and urgency.  Musculoskeletal: Negative.   Neurological: Negative.   Psychiatric/Behavioral: Negative.    Physical Exam   Blood pressure 124/68, pulse 92, temperature 98.9 F (37.2 C), temperature source Oral, resp. rate 18, last menstrual period 06/03/2020, SpO2 100 %.  Physical Exam Constitutional:      Appearance: Normal appearance.  HENT:  Head: Normocephalic.  Genitourinary:    General: Normal vulva.     Vagina: Vaginal discharge present.     Comments: NEFG, no blood in the vagina, no odor, + discharge consistent with BV. Batholins gland appears to be draining Neurological:     Mental Status: She is alert.     MAU Course  Procedures  MDM -[name] normal, no signs of abruption and no previa -Blood type O pos -NST: 150 bpm, mod var, present acel, no decels, no contractions  Assessment and Plan   1. Bacterial vaginosis   -discussed that bleeding could be post coital, recommended pelvic rest for two weeks -keep [name] appt on 18 -will treat for BV due to presence of discharge, vaginal bleeding and clue cells. -RX for flagyl given,discussed side effects.  -All questions answered, patient and partner verbalized  understanding of plan of care.   Charlesetta Garibaldi Delmont Prosch 11/11/2020, 11:52 PM

## 2020-11-12 ENCOUNTER — Ambulatory Visit: Payer: BC Managed Care – PPO

## 2020-11-12 DIAGNOSIS — O4692 Antepartum hemorrhage, unspecified, second trimester: Secondary | ICD-10-CM

## 2020-11-12 DIAGNOSIS — Z3A23 23 weeks gestation of pregnancy: Secondary | ICD-10-CM

## 2020-11-12 LAB — GC/CHLAMYDIA PROBE AMP (~~LOC~~) NOT AT ARMC
Chlamydia: NEGATIVE
Comment: NEGATIVE
Comment: NORMAL
Neisseria Gonorrhea: NEGATIVE

## 2020-11-13 ENCOUNTER — Ambulatory Visit: Payer: BC Managed Care – PPO

## 2020-11-13 ENCOUNTER — Other Ambulatory Visit: Payer: BC Managed Care – PPO

## 2020-11-15 ENCOUNTER — Other Ambulatory Visit: Payer: Self-pay

## 2020-11-15 ENCOUNTER — Encounter: Payer: Self-pay | Admitting: Obstetrics and Gynecology

## 2020-11-15 ENCOUNTER — Telehealth (INDEPENDENT_AMBULATORY_CARE_PROVIDER_SITE_OTHER): Payer: BC Managed Care – PPO | Admitting: Obstetrics and Gynecology

## 2020-11-15 DIAGNOSIS — Z3A23 23 weeks gestation of pregnancy: Secondary | ICD-10-CM

## 2020-11-15 DIAGNOSIS — Z3400 Encounter for supervision of normal first pregnancy, unspecified trimester: Secondary | ICD-10-CM

## 2020-11-15 DIAGNOSIS — Z3402 Encounter for supervision of normal first pregnancy, second trimester: Secondary | ICD-10-CM

## 2020-11-15 NOTE — Progress Notes (Signed)
   MY CHART VIDEO VIRTUAL OBSTETRICS VISIT ENCOUNTER NOTE  I connected with Rayburn Felt Brayman on 11/15/20 at  8:50 AM EDT by My Chart video at home and verified that I am speaking with the correct person using two identifiers. Provider located at [facility] for [employer] at [place].   I discussed the limitations, risks, security and privacy concerns of performing an evaluation and management service by My Chart video and the availability of in person appointments. I also discussed with the patient that there may be a patient responsible charge related to this service. The patient expressed understanding and agreed to proceed.  Subjective:  Angelica Mills is a 20 y.o. G1P0 at [redacted]w[redacted]d being followed for ongoing prenatal care.  She is currently monitored for the following issues for this low-risk pregnancy and has Supervision of normal first pregnancy, antepartum on their problem list.  Patient reports no more VB since being seen in MAU on 11/11/20, on 3rd day of taking BV (did not pick Rx from February). Reports fetal movement. Denies any contractions, bleeding or leaking of fluid.   The following portions of the patient's history were reviewed and updated as appropriate: allergies, current medications, past family history, past medical history, past social history, past surgical history and problem list.   Objective:   General:  Alert, oriented and cooperative.   Mental Status: Normal mood and affect perceived. Normal judgment and thought content.  Rest of physical exam deferred due to type of encounter  BP 125/88   Pulse (!) 109   Wt 205 lb (93 kg)   LMP 06/03/2020   BMI 31.17 kg/m  **Done by patient's own at home BP cuff and scale  Assessment and Plan:  Pregnancy: G1P0 at [redacted]w[redacted]d  1. Supervision of normal first pregnancy, antepartum - Anticipatory guidance for 2 hr GTT - advised to fast after midnight without anything to eat or drink (except for water), will have fasting  blood drawn, drink the glucola drink (flavor choices: orange, fruit punch or lemon-lime), have a visit with a provider during the first hour of testing, wait in the lab waiting room to have blood drawn at 1 hour and then 2 hours after finishing glucola drink.   Preterm labor symptoms and general obstetric precautions including but not limited to vaginal bleeding, contractions, leaking of fluid and fetal movement were reviewed in detail with the patient.  I discussed the assessment and treatment plan with the patient. The patient was provided an opportunity to ask questions and all were answered. The patient agreed with the plan and demonstrated an understanding of the instructions. The patient was advised to call back or seek an in-person office evaluation/go to MAU at [facility] for any urgent or concerning symptoms. Please refer to After Visit Summary for other counseling recommendations.   I provided 5 minutes of non-face-to-face time during this encounter. There was 5 minutes of chart review time spent prior to this encounter. Total time spent = 10 minutes.  Return in about 5 weeks (around 12/20/2020) for Return OB 2hr GTT.  Future Appointments  Date Time Provider Department Center  11/21/2020  1:15 PM [facility] NURSE [facility]  11/21/2020  1:30 PM [facility]  12/21/2020  8:30 AM Bernerd Limbo, CNM [facility] None  01/17/2021 10:30 AM Gerrit Heck, CNM [facility] None    Raelyn Mora, CNM Center for [employer], [facility]

## 2020-11-15 NOTE — Patient Instructions (Signed)
Please call our office at [phone] with any questions or concerns. If you get an email survey about your experience today please consider completing it to let us know what we are doing right or how we can improve. Thank you for allowing [name] to care for you today.    Oral Glucose Tolerance Test During Pregnancy Why am I having this test? The oral glucose tolerance test (OGTT) is done to check how your body processes blood sugar (glucose). This is one of several tests used to diagnose diabetes that develops during pregnancy (gestational diabetes mellitus). Gestational diabetes is a short-term form of diabetes that some women develop while they are pregnant. It usually occurs during the second trimester of pregnancy and goes away after delivery. Testing, or screening, for gestational diabetes usually occurs at weeks 24-28 of pregnancy. You may have the OGTT test after having a 1-hour glucose screening test if the results from that test indicate that you may have gestational diabetes. This test may also be needed if:  You have a history of gestational diabetes.  There is a history of giving birth to very large babies or of losing pregnancies (having stillbirths).  You have signs and symptoms of diabetes, such as: ? Changes in your eyesight. ? Tingling or numbness in your hands or feet. ? Changes in hunger, thirst, and urination, and these are not explained by your pregnancy. What is being tested? This test measures the amount of glucose in your blood at different times during a period of 3 hours. This shows how well your body can process glucose. What kind of sample is taken? Blood samples are required for this test. They are usually collected by inserting a needle into a blood vessel.   How do I prepare for this test?  For 3 days before your test, eat normally. Have plenty of carbohydrate-rich foods.  Follow instructions from your health care provider about: ? Eating or drinking restrictions  on the day of the test. You may be asked not to eat or drink anything other than water (to fast) starting 8-10 hours before the test. ? Changing or stopping your regular medicines. Some medicines may interfere with this test. Tell a health care provider about:  All medicines you are taking, including vitamins, herbs, eye drops, creams, and over-the-counter medicines.  Any blood disorders you have.  Any surgeries you have had.  Any medical conditions you have. What happens during the test? First, your blood glucose will be measured. This is referred to as your fasting blood glucose because you fasted before the test. Then, you will drink a glucose solution that contains a certain amount of glucose. Your blood glucose will be measured again 1, 2, and 3 hours after you drink the solution. This test takes about 3 hours to complete. You will need to stay at the testing location during this time. During the testing period:  Do not eat or drink anything other than the glucose solution.  Do not exercise.  Do not use any products that contain nicotine or tobacco, such as cigarettes, e-cigarettes, and chewing tobacco. These can affect your test results. If you need help quitting, ask your health care provider. The testing procedure may vary among health care providers and hospitals. How are the results reported? Your results will be reported as milligrams of glucose per deciliter of blood (mg/dL) or millimoles per liter (mmol/L). There is more than one source for screening and diagnosis reference values used to diagnose gestational diabetes. Your health  care provider will compare your results to normal values that were established after testing a large group of people (reference values). Reference values may vary among labs and hospitals. For this test (Carpenter-Coustan), reference values are:  Fasting: 95 mg/dL (5.3 mmol/L).  1 hour: 180 mg/dL (50.9 mmol/L).  2 hour: 155 mg/dL (8.6 mmol/L).  3  hour: 140 mg/dL (7.8 mmol/L). What do the results mean? Results below the reference values are considered normal. If two or more of your blood glucose levels are at or above the reference values, you may be diagnosed with gestational diabetes. If only one level is high, your health care provider may suggest repeat testing or other tests to confirm a diagnosis. Talk with your health care provider about what your results mean. Questions to ask your health care provider Ask your health care provider, or the department that is doing the test:  When will my results be ready?  How will I get my results?  What are my treatment options?  What other tests do I need?  What are my next steps? Summary  The oral glucose tolerance test (OGTT) is one of several tests used to diagnose diabetes that develops during pregnancy (gestational diabetes mellitus). Gestational diabetes is a short-term form of diabetes that some women develop while they are pregnant.  You may have the OGTT test after having a 1-hour glucose screening test if the results from that test show that you may have gestational diabetes. You may also have this test if you have any symptoms or risk factors for this type of diabetes.  Talk with your health care provider about what your results mean. This information is not intended to replace advice given to you by your health care provider. Make sure you discuss any questions you have with your health care provider. Document Revised: 12/01/2019 Document Reviewed: 12/01/2019 Elsevier Patient Education  2021 [employer].

## 2020-11-16 ENCOUNTER — Other Ambulatory Visit (INDEPENDENT_AMBULATORY_CARE_PROVIDER_SITE_OTHER): Payer: BC Managed Care – PPO | Admitting: *Deleted

## 2020-11-16 VITALS — Wt 207.8 lb

## 2020-11-16 DIAGNOSIS — Z3400 Encounter for supervision of normal first pregnancy, unspecified trimester: Secondary | ICD-10-CM

## 2020-11-16 NOTE — Progress Notes (Signed)
   Patient in clinic for AFP only today.  Clovis Pu, RN

## 2020-11-20 ENCOUNTER — Telehealth: Payer: Self-pay

## 2020-11-20 NOTE — Telephone Encounter (Signed)
Pt c/o white vaginal  discharge and itching to the peri vaginal area. Pt stated she just finished abx for BV. Sx began today.  Pt called to ask what kind of yeast medication she cn take.   Per Up to date and micromedex and [email or web address], pt can use 7 day course of either clotrimazole or miconazole.   Will forward to Raelyn Mora , CNM to review.

## 2020-11-21 ENCOUNTER — Telehealth: Payer: Self-pay | Admitting: Licensed Clinical Social Worker

## 2020-11-21 ENCOUNTER — Other Ambulatory Visit: Payer: Self-pay

## 2020-11-21 ENCOUNTER — Ambulatory Visit: Payer: BC Managed Care – PPO | Admitting: *Deleted

## 2020-11-21 ENCOUNTER — Encounter: Payer: Self-pay | Admitting: *Deleted

## 2020-11-21 ENCOUNTER — Ambulatory Visit: Payer: BC Managed Care – PPO | Attending: Obstetrics and Gynecology

## 2020-11-21 ENCOUNTER — Other Ambulatory Visit: Payer: Self-pay | Admitting: Obstetrics and Gynecology

## 2020-11-21 DIAGNOSIS — Z3400 Encounter for supervision of normal first pregnancy, unspecified trimester: Secondary | ICD-10-CM | POA: Diagnosis not present

## 2020-11-21 LAB — AFP, SERUM, OPEN SPINA BIFIDA
AFP MoM: 1.13
AFP Value: 90.9 ng/mL
Gest. Age on Collection Date: 23 weeks
Maternal Age At EDD: 21.4 yr
OSBR Risk 1 IN: 10000
Test Results:: NEGATIVE
Weight: 208 [lb_av]

## 2020-11-21 NOTE — Telephone Encounter (Signed)
Pt called in requesting lab results. Advised pt nurse is currently with a patient to send secured message through mychart.

## 2020-11-22 ENCOUNTER — Other Ambulatory Visit: Payer: Self-pay | Admitting: Obstetrics and Gynecology

## 2020-11-22 DIAGNOSIS — B3731 Acute candidiasis of vulva and vagina: Secondary | ICD-10-CM

## 2020-11-22 DIAGNOSIS — B373 Candidiasis of vulva and vagina: Secondary | ICD-10-CM

## 2020-11-22 MED ORDER — TERCONAZOLE 0.4 % VA CREA: 1.0000 | TOPICAL_CREAM | Freq: Every day | VAGINAL | 0 refills | Status: AC

## 2020-12-21 ENCOUNTER — Other Ambulatory Visit: Payer: Self-pay

## 2020-12-21 ENCOUNTER — Ambulatory Visit (INDEPENDENT_AMBULATORY_CARE_PROVIDER_SITE_OTHER): Payer: BC Managed Care – PPO | Admitting: Certified Nurse Midwife

## 2020-12-21 ENCOUNTER — Encounter: Payer: BC Managed Care – PPO | Admitting: Certified Nurse Midwife

## 2020-12-21 VITALS — BP 124/88 | HR 94 | Wt 211.0 lb

## 2020-12-21 DIAGNOSIS — Z3A28 28 weeks gestation of pregnancy: Secondary | ICD-10-CM | POA: Diagnosis not present

## 2020-12-21 DIAGNOSIS — Z3403 Encounter for supervision of normal first pregnancy, third trimester: Secondary | ICD-10-CM

## 2020-12-21 MED ORDER — BLOOD GLUCOSE MONITOR KIT: PACK | 0 refills | Status: DC

## 2020-12-21 NOTE — Progress Notes (Signed)
Pt declines GTT with glucose drink. Will check sugars 4 times a day for one week.  PHQ score 5 GAD 4 Pt will research Tdap- might get at next appt

## 2020-12-21 NOTE — Progress Notes (Signed)
   PRENATAL VISIT NOTE  Subjective:  Angelica Mills is a 20 y.o. G1P0 at [redacted]w[redacted]d being seen today for ongoing prenatal care.  She is currently monitored for the following issues for this low-risk pregnancy and has Supervision of normal first pregnancy, antepartum on their problem list.  Patient reports no complaints.  Contractions: Not present. Vag. Bleeding: None.  Movement: Present. Denies leaking of fluid.   The following portions of the patient's history were reviewed and updated as appropriate: allergies, current medications, past family history, past medical history, past social history, past surgical history and problem list.   Objective:   Vitals:   12/21/20 0839  BP: 124/88  Pulse: 94  Weight: 211 lb (95.7 kg)    Fetal Status: Fetal Heart Rate (bpm): 142 Fundal Height: 28 cm Movement: Present     General:  Alert, oriented and cooperative. Patient is in no acute distress.  Skin: Skin is warm and dry. No rash noted.   Cardiovascular: Normal heart rate noted  Respiratory: Normal respiratory effort, no problems with respiration noted  Abdomen: Soft, gravid, appropriate for gestational age.  Pain/Pressure: Absent     Pelvic: Cervical exam deferred        Extremities: Normal range of motion.  Edema: None  Mental Status: Normal mood and affect. Normal behavior. Normal judgment and thought content.   Assessment and Plan:  Pregnancy: G1P0 at [redacted]w[redacted]d 1. Encounter for supervision of low-risk first pregnancy in third trimester - Doing well, feeling regular and vigorous fetal movement - Declines GTT test because she has an allergic reaction (hives) to the sodium benzoate in sodas and does not want to chance it on the glucola. Did not realize the ingredients until just before her visit so would rather test her glucose than go buy [employer] jelly beans. - Emphasized importance of testing/screening for GDM, pt understanding and amenable to daily testing for the next week with review at  her next visit. - Will add A1C and CMP to today's bloodwork, pt given instructions on 4x daily testing.  2. [redacted] weeks gestation of pregnancy - Routine OB care - HIV antibody (with reflex) - CBC - RPR - blood glucose meter kit and supplies KIT; Dispense based on patient and insurance preference. Use up to four times daily as directed. (Patient not taking: Reported on 12/21/2020)  Dispense: 1 each; Refill: 0 - Hemoglobin A1c - Comp Met (CMET)  Preterm labor symptoms and general obstetric precautions including but not limited to vaginal bleeding, contractions, leaking of fluid and fetal movement were reviewed in detail with the patient. Please refer to After Visit Summary for other counseling recommendations.   Return in about 2 weeks (around 01/04/2021) for IN-PERSON, LOB.  Future Appointments  Date Time Provider [place]  01/09/2021  8:50 AM Laury Deep, CNM [facility] None  01/17/2021 10:30 AM Gavin Pound, CNM [facility] None  02/15/2021  8:50 AM Gavin Pound, [place] None   Gabriel Carina, CNM

## 2020-12-22 LAB — CBC
Hematocrit: 33.8 % — ABNORMAL LOW (ref 34.0–46.6)
Hemoglobin: 11.1 g/dL (ref 11.1–15.9)
MCH: 26.9 pg (ref 26.6–33.0)
MCHC: 32.8 g/dL (ref 31.5–35.7)
MCV: 82 fL (ref 79–97)
Platelets: 343 10*3/uL (ref 150–450)
RBC: 4.13 x10E6/uL (ref 3.77–5.28)
RDW: 12.2 % (ref 11.7–15.4)
WBC: 10.2 10*3/uL (ref 3.4–10.8)

## 2020-12-22 LAB — COMPREHENSIVE METABOLIC PANEL
ALT: 9 IU/L (ref 0–32)
AST: 11 IU/L (ref 0–40)
Albumin/Globulin Ratio: 1.1 — ABNORMAL LOW (ref 1.2–2.2)
Albumin: 3.5 g/dL — ABNORMAL LOW (ref 3.9–5.0)
Alkaline Phosphatase: 81 IU/L (ref 44–121)
BUN/Creatinine Ratio: 14 (ref 9–23)
BUN: 8 mg/dL (ref 6–20)
Bilirubin Total: <0.2 mg/dL (ref 0.0–1.2)
CO2: 21 mmol/L (ref 20–29)
Calcium: 9 mg/dL (ref 8.7–10.2)
Chloride: 102 mmol/L (ref 96–106)
Creatinine, Ser: 0.56 mg/dL — ABNORMAL LOW (ref 0.57–1.00)
Globulin, Total: 3.1 g/dL (ref 1.5–4.5)
Glucose: 80 mg/dL (ref 65–99)
Potassium: 4.2 mmol/L (ref 3.5–5.2)
Sodium: 137 mmol/L (ref 134–144)
Total Protein: 6.6 g/dL (ref 6.0–8.5)
eGFR: 133 mL/min/{1.73_m2} (ref >59)

## 2020-12-22 LAB — HEMOGLOBIN A1C
Est. average glucose Bld gHb Est-mCnc: 111 mg/dL
Hgb A1c MFr Bld: 5.5 % (ref 4.8–5.6)

## 2020-12-22 LAB — HIV ANTIBODY (ROUTINE TESTING W REFLEX): HIV Screen 4th Generation wRfx: NONREACTIVE

## 2020-12-22 LAB — RPR: RPR Ser Ql: NONREACTIVE

## 2020-12-27 ENCOUNTER — Encounter: Payer: BC Managed Care – PPO | Admitting: Obstetrics and Gynecology

## 2021-01-09 ENCOUNTER — Encounter: Payer: Self-pay | Admitting: Obstetrics and Gynecology

## 2021-01-09 ENCOUNTER — Other Ambulatory Visit: Payer: Self-pay

## 2021-01-09 ENCOUNTER — Ambulatory Visit (INDEPENDENT_AMBULATORY_CARE_PROVIDER_SITE_OTHER): Payer: BC Managed Care – PPO | Admitting: Obstetrics and Gynecology

## 2021-01-09 VITALS — BP 116/79 | HR 100 | Temp 98.2°F | Wt 212.8 lb

## 2021-01-09 DIAGNOSIS — Z3A31 31 weeks gestation of pregnancy: Secondary | ICD-10-CM

## 2021-01-09 DIAGNOSIS — Z3403 Encounter for supervision of normal first pregnancy, third trimester: Secondary | ICD-10-CM

## 2021-01-09 DIAGNOSIS — Z3400 Encounter for supervision of normal first pregnancy, unspecified trimester: Secondary | ICD-10-CM

## 2021-01-09 NOTE — Patient Instructions (Signed)
Group B Streptococcus Test During Pregnancy °Why am I having this test? °Routine testing, also called screening, for group B streptococcus (GBS) is recommended for all pregnant women between the 36th and 37th week of pregnancy. GBS is a type of bacteria that can be passed from mother to baby during childbirth. Screening will help guide whether or not you will need treatment during labor and delivery to prevent complications such as: °An infection in your uterus during labor. °An infection in your uterus after delivery. °A serious infection in your baby after delivery, such as pneumonia, meningitis, or sepsis. °GBS screening is not often done before 36 weeks of pregnancy unless you go into labor prematurely. °What happens if I have group B streptococcus? °If testing shows that you have GBS, your health care provider will recommend treatment with IV antibiotics during labor and delivery. This treatment significantly decreases the risk of complications for you and your baby. °If you have a planned C-section and you have GBS, you may not need to be treated with antibiotics because GBS is usually passed to babies after labor starts and your water breaks. If you are in labor or your water breaks before your C-section, it is possible for GBS to get into your uterus and be passed to your baby, so you might need treatment. °Is there a chance I may not need to be tested? °You may not need to be tested for GBS if: °You have a urine test that shows GBS before 36 to 37 weeks. °You had a baby with GBS infection after a previous delivery. °In these cases, you will automatically be treated for GBS during labor and delivery. °What is being tested? °This test is done to check if you have group B streptococcus in your vagina or rectum. °What kind of sample is taken? °To collect samples for this test, your health care provider will swab your vagina and rectum with a cotton swab. The sample is then sent to the lab to see if GBS is  present. °What happens during the test? ° °You will remove your clothing from the waist down. °You will lie down on an exam table in the same position as you would for a pelvic exam. °Your health care provider will swab your vagina and rectum to collect samples for a culture test. °You will be able to go home after the test and do all your usual activities. °How are the results reported? °The test results are reported as positive or negative. °What do the results mean? °A positive test means you are at risk for passing GBS to your baby during labor and delivery. Your health care provider will recommend that you are treated with an IV antibiotic during labor and delivery. °A negative test means you are at very low risk of passing GBS to your baby. There is still a low risk of passing GBS to your baby because sometimes test results may report that you do not have a condition when you do (false-negative result) or there is a chance that you may become infected with GBS after the test is done. You most likely will not need to be treated with an antibiotic during labor and delivery. °Talk with your health care provider about what your results mean. °Questions to ask your health care provider °Ask your health care provider, or the department that is doing the test: °When will my results be ready? °How will I get my results? °What are my treatment options? °Summary °Routine testing (screening) for   group B streptococcus (GBS) is recommended for all pregnant women between the 36th and 37th week of pregnancy. °GBS is a type of bacteria that can be passed from mother to baby during childbirth. °If testing shows that you have GBS, your health care provider will recommend that you are treated with IV antibiotics during labor and delivery. This treatment almost always prevents infection in newborns. °This information is not intended to replace advice given to you by your health care provider. Make sure you discuss any questions  you have with your health care provider. °Document Revised: 04/24/2020 Document Reviewed: 07/21/2018 °Elsevier Patient Education © 2022 [employer] ° °

## 2021-01-09 NOTE — Progress Notes (Signed)
LOW-RISK PREGNANCY OFFICE VISIT Patient name: Angelica Mills MRN [id number]  Date of birth: 29-May-2001 Chief Complaint:   Routine Prenatal Visit  History of Present Illness:   Garry Bochicchio is a 20 y.o. G1P0 female at [redacted]w[redacted]d with an Estimated Date of Delivery: 03/10/21 being seen today for ongoing management of a low-risk pregnancy.  Today she reports  she had lower RT pelvis pain yesterday during work, but resolved after drinking water and laying on LT side for a while . No pain today. Was checking BS 4x daily, but not anymore. "We just moved back to [place] from [place] and mistakenly left meter and supplies/log there. I will need to get my mom to send me those numbers." Contractions: Not present. Vag. Bleeding: None.  Movement: Present. denies leaking of fluid. Review of Systems:   Pertinent items are noted in HPI Denies abnormal vaginal discharge w/ itching/odor/irritation, headaches, visual changes, shortness of breath, chest pain, abdominal pain, severe nausea/vomiting, or problems with urination or bowel movements unless otherwise stated above. Pertinent History Reviewed:  Reviewed past medical,surgical, social, obstetrical and family history.  Reviewed problem list, medications and allergies. Physical Assessment:   Vitals:   01/09/21 0903  BP: 116/79  Pulse: 100  Temp: 98.2 F (36.8 C)  Weight: 212 lb 12.8 oz (96.5 kg)  Body mass index is 32.36 kg/m.        Physical Examination:   General appearance: Well appearing, and in no distress  Mental status: Alert, oriented to person, place, and time  Skin: Warm & dry  Cardiovascular: Normal heart rate noted  Respiratory: Normal respiratory effort, no distress  Abdomen: Soft, gravid, nontender  Pelvic: Cervical exam deferred         Extremities: Edema: None  Fetal Status: Fetal Heart Rate (bpm): 141 Fundal Height: 31 cm Movement: Present    No results found for this or any previous visit (from the past 24  hour(s)).  Assessment & Plan:  1) Low-risk pregnancy G1P0 at [redacted]w[redacted]d with an Estimated Date of Delivery: 03/10/21   2) Encounter for supervision of low-risk first pregnancy in third trimester - Advised to send the BS log numbers via MyChart - Last HgB A1C was 5.5 - Explained some RLP could be normal at this gestation, especially with a sitting job - Anticipatory guidance for GBS screening at 36 wks. Explained the test is important to be done at this time in pregnancy to ensure adequate treatment at the time of delivery. Explained that a positive result does not mean any harm to her, but can be harmful to the baby. Meaning that if baby is exposed to the bacteria for too long without antibiotics, the bay has the potential to develop pneumonia, septicemia, or spinal meningitis and could end up in the NICU. Also, explained that a cervical exam may be performed at the time of testing to get a baseline cervical check and make sure there is no preterm cervical dilation.   3) [redacted] weeks gestation of pregnancy    Meds: No orders of the defined types were placed in this encounter.  Labs/procedures today: none  Plan:  Continue routine obstetrical care   Reviewed: Preterm labor symptoms and general obstetric precautions including but not limited to vaginal bleeding, contractions, leaking of fluid and fetal movement were reviewed in detail with the patient.  All questions were answered. Has home bp cuff. Check bp weekly, let us know if >140/90.   Follow-up: Return in about 2 weeks (around 01/23/2021) for  Return OB - My Chart video.  No orders of the defined types were placed in this encounter.  [name], CNM 01/09/2021 9:26 AM

## 2021-01-17 ENCOUNTER — Telehealth: Payer: BC Managed Care – PPO

## 2021-01-23 ENCOUNTER — Other Ambulatory Visit: Payer: Self-pay

## 2021-01-23 ENCOUNTER — Encounter: Payer: Self-pay | Admitting: Obstetrics and Gynecology

## 2021-01-23 ENCOUNTER — Telehealth (INDEPENDENT_AMBULATORY_CARE_PROVIDER_SITE_OTHER): Payer: BC Managed Care – PPO | Admitting: Obstetrics and Gynecology

## 2021-01-23 VITALS — Wt 215.0 lb

## 2021-01-23 DIAGNOSIS — Z3403 Encounter for supervision of normal first pregnancy, third trimester: Secondary | ICD-10-CM

## 2021-01-23 DIAGNOSIS — Z3A33 33 weeks gestation of pregnancy: Secondary | ICD-10-CM

## 2021-01-23 DIAGNOSIS — Z3400 Encounter for supervision of normal first pregnancy, unspecified trimester: Secondary | ICD-10-CM

## 2021-01-23 NOTE — Progress Notes (Signed)
MY CHART VIDEO VIRTUAL OBSTETRICS VISIT ENCOUNTER NOTE  I connected with Angelica Mills on 01/23/21 at  8:50 AM EDT by My Chart video at home and verified that I am speaking with the correct person using two identifiers. Provider located at [facility] for [facility] at [place].   I discussed the limitations, risks, security and privacy concerns of performing an evaluation and management service by My Chart video and the availability of in person appointments. I also discussed with the patient that there may be a patient responsible charge related to this service. The patient expressed understanding and agreed to proceed.  I discussed the limitations of telemedicine and the availability of in person appointments. Discussed there is a possibility of technology failure and discussed alternative modes of communication if that failure occurs.  Subjective:  Angelica Mills is a 20 y.o. G1P0 at [redacted]w[redacted]d being followed for ongoing prenatal care.  She is currently monitored for the following issues for this low-risk pregnancy and has Supervision of normal first pregnancy, antepartum on their problem list.  Patient reports no complaints. She reports "still being very busy trying to get settled after moving." She is trying to locate her BP cuff. She will send her VS and BS values via MyChart. Reports fetal movement. Denies any contractions, bleeding or leaking of fluid.   The following portions of the patient's history were reviewed and updated as appropriate: allergies, current medications, past family history, past medical history, past social history, past surgical history and problem list.   Objective:   General:  Alert, oriented and cooperative.   Mental Status: Normal mood and affect perceived. Normal judgment and thought content.  Rest of physical exam deferred due to type of encounter  Wt 215 lb (97.5 kg)   LMP 06/03/2020   BMI 32.69 kg/m  **Done by patient's own at home BP  cuff and scale  Assessment and Plan:  Pregnancy: G1P0 at [redacted]w[redacted]d  1. Supervision of normal first pregnancy, antepartum - Stressed the importance of checking her BS since she declined the GTT and need to send the values via MyChart. - Anticipatory guidance for GBS screening at 36 wks. Explained the test is important to be done at this time in pregnancy to ensure adequate treatment at the time of delivery. Explained that a positive result does not mean any harm to her, but can be harmful to the baby. Meaning that if baby is exposed to the bacteria for too long without antibiotics, the bay has the potential to develop pneumonia, septicemia, or spinal meningitis and could end up in the NICU. Also, explained that a cervical exam may be performed at the time of testing to get a baseline cervical check and make sure there is no preterm cervical dilation.    2. [redacted] weeks gestation of pregnancy   Preterm labor symptoms and general obstetric precautions including but not limited to vaginal bleeding, contractions, leaking of fluid and fetal movement were reviewed in detail with the patient.  I discussed the assessment and treatment plan with the patient. The patient was provided an opportunity to ask questions and all were answered. The patient agreed with the plan and demonstrated an understanding of the instructions. The patient was advised to call back or seek an in-person office evaluation/go to [facility] at [facility] for any urgent or concerning symptoms. Please refer to After Visit Summary for other counseling recommendations.   I provided 10 minutes of non-face-to-face time during this encounter. There was  5 minutes of chart review time spent prior to this encounter. Total time spent = 15 minutes.  Return in about 3 weeks (around 02/13/2021) for Return OB w/GBS.  Future Appointments  Date Time Provider Department Center  02/15/2021  8:50 AM Gerrit Heck, CNM [facility] None    Raelyn Mora, CNM [facility], [facility]

## 2021-01-23 NOTE — Patient Instructions (Signed)
Group B Streptococcus Test During Pregnancy °Why am I having this test? °Routine testing, also called screening, for group B streptococcus (GBS) is recommended for all pregnant women between the 36th and 37th week of pregnancy. GBS is a type of bacteria that can be passed from mother to baby during childbirth. Screening will help guide whether or not you will need treatment during labor and delivery to prevent complications such as: °An infection in your uterus during labor. °An infection in your uterus after delivery. °A serious infection in your baby after delivery, such as pneumonia, meningitis, or sepsis. °GBS screening is not often done before 36 weeks of pregnancy unless you go into labor prematurely. °What happens if I have group B streptococcus? °If testing shows that you have GBS, your health care provider will recommend treatment with IV antibiotics during labor and delivery. This treatment significantly decreases the risk of complications for you and your baby. °If you have a planned C-section and you have GBS, you may not need to be treated with antibiotics because GBS is usually passed to babies after labor starts and your water breaks. If you are in labor or your water breaks before your C-section, it is possible for GBS to get into your uterus and be passed to your baby, so you might need treatment. °Is there a chance I may not need to be tested? °You may not need to be tested for GBS if: °You have a urine test that shows GBS before 36 to 37 weeks. °You had a baby with GBS infection after a previous delivery. °In these cases, you will automatically be treated for GBS during labor and delivery. °What is being tested? °This test is done to check if you have group B streptococcus in your vagina or rectum. °What kind of sample is taken? °To collect samples for this test, your health care provider will swab your vagina and rectum with a cotton swab. The sample is then sent to the lab to see if GBS is  present. °What happens during the test? ° °You will remove your clothing from the waist down. °You will lie down on an exam table in the same position as you would for a pelvic exam. °Your health care provider will swab your vagina and rectum to collect samples for a culture test. °You will be able to go home after the test and do all your usual activities. °How are the results reported? °The test results are reported as positive or negative. °What do the results mean? °A positive test means you are at risk for passing GBS to your baby during labor and delivery. Your health care provider will recommend that you are treated with an IV antibiotic during labor and delivery. °A negative test means you are at very low risk of passing GBS to your baby. There is still a low risk of passing GBS to your baby because sometimes test results may report that you do not have a condition when you do (false-negative result) or there is a chance that you may become infected with GBS after the test is done. You most likely will not need to be treated with an antibiotic during labor and delivery. °Talk with your health care provider about what your results mean. °Questions to ask your health care provider °Ask your health care provider, or the department that is doing the test: °When will my results be ready? °How will I get my results? °What are my treatment options? °Summary °Routine testing (screening) for   group B streptococcus (GBS) is recommended for all pregnant women between the 36th and 37th week of pregnancy. °GBS is a type of bacteria that can be passed from mother to baby during childbirth. °If testing shows that you have GBS, your health care provider will recommend that you are treated with IV antibiotics during labor and delivery. This treatment almost always prevents infection in newborns. °This information is not intended to replace advice given to you by your health care provider. Make sure you discuss any questions  you have with your health care provider. °Document Revised: 04/24/2020 Document Reviewed: 07/21/2018 °Elsevier Patient Education © 2022 [employer] ° °

## 2021-02-15 ENCOUNTER — Other Ambulatory Visit (HOSPITAL_COMMUNITY)
Admission: RE | Admit: 2021-02-15 | Discharge: 2021-02-15 | Disposition: A | Discharge status: Home / Self Care | Payer: BC Managed Care – PPO | Source: Ambulatory Visit

## 2021-02-15 ENCOUNTER — Other Ambulatory Visit: Payer: Self-pay

## 2021-02-15 ENCOUNTER — Ambulatory Visit (INDEPENDENT_AMBULATORY_CARE_PROVIDER_SITE_OTHER): Payer: BC Managed Care – PPO

## 2021-02-15 VITALS — BP 113/76 | HR 123 | Temp 98.7°F | Wt 221.0 lb

## 2021-02-15 DIAGNOSIS — Z124 Encounter for screening for malignant neoplasm of cervix: Secondary | ICD-10-CM | POA: Diagnosis present

## 2021-02-15 DIAGNOSIS — Z3400 Encounter for supervision of normal first pregnancy, unspecified trimester: Secondary | ICD-10-CM

## 2021-02-15 DIAGNOSIS — Z3403 Encounter for supervision of normal first pregnancy, third trimester: Secondary | ICD-10-CM | POA: Diagnosis present

## 2021-02-15 DIAGNOSIS — Z3A36 36 weeks gestation of pregnancy: Secondary | ICD-10-CM

## 2021-02-15 DIAGNOSIS — B373 Candidiasis of vulva and vagina: Secondary | ICD-10-CM

## 2021-02-15 DIAGNOSIS — B3731 Acute candidiasis of vulva and vagina: Secondary | ICD-10-CM

## 2021-02-15 MED ORDER — TERCONAZOLE 0.4 % VA CREA: 1.0000 | TOPICAL_CREAM | Freq: Every day | VAGINAL | 0 refills | Status: DC

## 2021-02-15 NOTE — Progress Notes (Signed)
LOW-RISK PREGNANCY OFFICE VISIT  Patient name: Angelica Mills MRN [id number]  Date of birth: December 26, 2000 Chief Complaint:   Routine Prenatal Visit  Subjective:   Angelica Mills is a 20 y.o. G1P0 female at [redacted]w[redacted]d with an Estimated Date of Delivery: 03/10/21 being seen today for ongoing management of a low-risk pregnancy aeb has Supervision of normal first pregnancy, antepartum on their problem list.  Patient presents today with no complaints.  Patient endorses fetal movement. Patient denies abdominal cramping or contractions.  Patient reports vaginal concerns some vaginal including abnormal discharge, leaking of fluid, and bleeding.  Contractions: Not present. Vag. Bleeding: None.  Movement: Present.  Reviewed past medical,surgical, social, obstetrical and family history as well as problem list, medications and allergies.  Objective   Vitals:   02/15/21 0907  BP: 113/76  Pulse: (!) 123  Temp: 98.7 F (37.1 C)  Weight: 221 lb (100.2 kg)  Body mass index is 33.6 kg/m.  Total Weight Gain:30 lb (13.6 kg)         Physical Examination:   General appearance: Well appearing, and in no distress  Mental status: Alert, oriented to person, place, and time  Skin: Warm & dry  Cardiovascular: Normal heart rate noted  Respiratory: Normal respiratory effort, no distress  Abdomen: Soft, gravid, nontender, AGA with Fundal Height: 38 cm  Pelvic: Cervical exam deferred  Dilation: Closed        Vaginal: External irritation and erythema.  Small amt milky white discharge.  Internal: Vault with moderate amt thick greenish white curdy discharge.    Cervix: Closed, Ecoptrian with mucoid discharge. Pap collected with brush and spatula.  Extremities: Edema: None  Fetal Status: Fetal Heart Rate (bpm): 140  Movement: Present   No results found for this or any previous visit (from the past 24 hour(s)).  Assessment & Plan:  Low-risk pregnancy of a 20 y.o., G1P0 at [redacted]w[redacted]d with an Estimated Date  of Delivery: 03/10/21   1. Supervision of normal first pregnancy, antepartum -Anticipatory guidance for upcoming appts. -Patient to schedule next appt in 1 weeks for an in-person visit. -Labs as below. -Educated on GBS bacteria including what it is, why we test, and how and when we treat if needed. -Discussed releasing of results to mychart.  2. Pap smear for cervical cancer screening -Pap collected. -Reviewed releasing of results. -Informed that if normal, will repeat in 3 years.   3. Vaginal yeast infection -Exam findings significant for yeast. -Will send Rx for Terazol cream.    4. [redacted] weeks gestation of pregnancy -Doing well. -Discussed and reviewed postpartum planning including contraception, pediatricians, and infant feedings. -Patient desires to breastfeed and is considering no method for contraception. -Patient given peds list.    Meds:  Meds ordered this encounter  Medications   terconazole (TERAZOL 7) 0.4 % vaginal cream    Sig: Place 1 applicator vaginally at bedtime. Use for seven days    Dispense:  45 g    Refill:  0    Order Specific Question:   Supervising Provider    Answer:   Reva Bores [[id number]]   Labs/procedures today: Lab Orders         Culture, beta strep (group b only)      Reviewed: Preterm labor symptoms and general obstetric precautions including but not limited to vaginal bleeding, contractions, leaking of fluid and fetal movement were reviewed in detail with the patient.  All questions were answered.  Follow-up: Return in about 2 weeks (around  03/01/2021) for LROB.  Orders Placed This Encounter  Procedures   Culture, beta strep (group b only)   Cherre Robins MSN, CNM 02/15/2021

## 2021-02-15 NOTE — Patient Instructions (Signed)
AREA PEDIATRIC/FAMILY PRACTICE PHYSICIANS  [place] ([address]) [facility] Chambliss, MD; Eniola, MD; Hale, MD; Hensel, MD; McDiarmid, MD; McIntyer, MD; Neal, MD; Walden, MD [address] [place], [place] [address] [phone] Mon-Fri 8:30-12:30, 1:30-5:00 Providers come to see babies at [facility] Accepting Medicaid [facility] providers who accept newborns: Koirala, MD; Morrow, MD; Wolters, MD [address], [place], [place] [address] [phone] Mon-Fri 8:00-5:30 Babies seen by providers at [facility] Does NOT accept Medicaid Please call early in hospitalization for appointment (limited availability)  [facility] Mulberry, MD [address] [place], [place] [address] [phone] Mon, Tue, Thur, Fri 8:30-5:00, Wed 10:00-7:00 (closed 1-2pm) Babies seen by [facility] providers Accepting Medicaid Rubin - Pediatrician Rubin, MD [address], [place], [place] [address] [phone] Mon-Fri 8:30-5:00, Sat 8:30-12:00 Provider comes to see babies at [facility] Accepting Medicaid Must have been referred from current patients or contacted office prior to delivery [facility] ([facility]) Brown, MD; Chandler, MD; Ettefagh, MD; Grant, MD; Lester, MD; McCormick, MD; McQueen, MD; Prose, MD; Simha, MD; Stanley, MD; Stryffeler, NP; Tebben, NP [address], [place], [place] [address] [phone] Mon, Tue, Thur, Fri 8:30-5:30, Wed 9:30-5:30, Sat 8:30-12:30 Babies seen by [facility] providers Accepting Medicaid Only accepting infants of first-time parents or siblings of current patients Hospital discharge coordinator will make follow-up appointment Jack Amos [address], [place], [place]  [address] [phone]   Fax - [phone] [facility] [address], [place], [place]  [address] Phone - [phone]   Fax - [phone] Shilpa Gosrani [address], [place], [place]  [address] [phone]  [place] ([address]) [facility] Bates, MD; Brassfield, MD; Cooper, Cox, MD; MD; Davis, MD; Dovico, MD; Ettefaugh, MD; Little, MD; Lowe, MD; Keiffer, MD; Melvin, MD; Sumner, MD; Williams, MD [address], [place], [place] [address] [phone] Mon-Fri 8:30-5:00 (extended evenings Mon-Thur as needed), Sat-Sun 10:00-1:00 Providers come to see babies at [facility] Accepting Medicaid for families of first-time babies and families with all children in the household age 3 and under. Must register with office prior to making appointment (M-F only). [facility] Henson, NP; Knapp, MD; Lalonde, MD; Tysinger, PA [address] [place], [place] [address] [phone] Mon-Fri 8:00-5:00 Babies seen by providers at [facility] Does NOT accept Medicaid/Commercial Insurance Only [facility] ([facility])  Artis, MD; Barnes, MD; Bratton, MD; Coccaro, MD; [place] Gardner, MD; Kramer, MD; Marshall, MD; Netherton, MD; Poleto, MD; Skinner, MD [address] [place], [place] [address] [phone] Mon-Fri 8:30-5:30, Sat (Oct.-Mar.) 9:00-1:00 Babies seen by providers at [facility] Accepting Medicaid  [place] ([address]) [facility] Reid, MD; Warner, MD [address], [place], [place] [address] [phone] Mon-Fri 8:30-5:00, Sat 8:30-12:00 Providers come to see babies at [facility] Does NOT accept Medicaid [facility] Becker, PA; Hagler, MD; Scifres, PA; Sun, MD; Swayne, MD [address], [place],  [address] [phone] Mon-Fri 8:00-5:00 Babies seen by providers at [facility] Does NOT accept Medicaid Only accepting babies of parents who  are patients Please call early in hospitalization for appointment (limited availability) [facility] Clark, MD; Frye, MD; Kelleher, MD; Mack, NP; Miller, MD; O'Keller, MD; Patterson, NP; Pudlo, MD; Puzio, MD; Thomas, MD; Tucker, MD; Twiselton, MD [address], [place], [place] [address] [phone] Mon-Fri 8:00-5:00, Sat 9:00-12:00 Providers come to see babies at [facility] Does NOT accept Medicaid  [place] ([address]) [facility] providers accepting new patients: Brake, NP; Wharton, PA [address], [place], [place] [address] [phone] Mon-Fri 8:00-5:00 Babies seen by providers at [facility] Does NOT accept Medicaid Only accepting babies of parents who are patients Please call early in hospitalization for appointment (limited availability) [facility] Gay, MD; Quinlan, MD [address] [place], [place] [address] [phone] (press 1 to schedule appointment) Mon-Fri 8:00-5:00 Providers come to see babies at [facility] Does NOT accept Medicaid [facility] Mazer, MD [address] [place], [place] [address] [phone] Mon-Fri 8:30-5:00 (lunch 12:30-1:00), extended hours by appointment only Wed 5:00-6:30 Babies seen by [facility] providers Accepting Medicaid [facility] Banks, MD; Jordan, MD; Koberlein, MD [address], [place], [place] [address] [phone] Mon-Fri 8:00-5:00 Babies seen by [facility] providers Does NOT accept Medicaid [facility] Parker, MD; Hunter, MD; Wallace, DO [address] [place], [place] [address] [phone] Mon-Fri 8:00-5:00 Babies seen by [facility] providers Does NOT accept Medicaid [facility] Brandon, PA; Brecken, PA; Christy, NP; Dees, MD; DeClaire, MD; DeWeese, MD; Hansen, NP; Mills, NP; Parrish, NP; Smoot, NP; Summer, MD; Vapne,  MD [address] [place], [place] [address] [phone] Mon-Fri 8:30-5:00, Sat 10:00-1:00 Providers come to see babies at [facility] Does NOT accept Medicaid Free prenatal information session Tuesdays at 4:45pm [facility] Bouska, MD; Gordon, PA; Jeffery, PA; Weber, PA [address] [place] [address] [phone] Mon-Fri 7:30-5:30 Babies seen by [facility] providers [facility] [address], [place], [place]  [address] [phone]   Fax - [phone]  [place] ([address] & [address]) [facility] [place] [address] [place], [place] [address] [phone] Mon-Thur 8:00-6:00 Providers come to see babies at [facility] Accepting Medicaid [facility] Anderson, NP; Badger, MD; Beal, PA; Spencer, PA [address] [place], [place] [address] [phone] Mon-Thur 7:30-7:30, Fri 7:30-4:30 Babies seen by [facility] providers Accepting Medicaid [facility] Agbuya, MD; Klett, NP; Romgoolam, MD [address], [place], [place] [address] [phone] Mon-Fri 8:30-5:00, Sat 8:30-12:00 Providers come to see babies at [facility] Accepting Medicaid Must have "Meet & Greet" appointment at office prior to delivery [facility] ([facility]) McCord, MD; Wallace, MD; Wood, MD [address], [place], [place] [address] [phone] Mon-Wed 8:00-6:00, Thur-Fri 8:00-5:00, Sat 9:00-12:00 Providers come to see babies at [facility] Does NOT accept Medicaid Only accepting siblings of current patients [facility]  [address], [place], [place]  [address] [phone]   Fax - [phone] [facility] [address], [place], [place]  [address] [phone]   Fax -  [phone]  [place] ([address] & [address]) [facility] Cirigliano, DO; Matthews, DO [address] [place], [place] [address] [phone] Mon-Fri 7:00-5:00 Babies seen by [facility] providers Does NOT accept Medicaid [facility] Briscoe, MD; Howley, PA; Moreira, PA [address], [place], [place] [address] [phone] Mon-Fri 8:00-5:00 Babies seen by [facility] providers Accepting Medicaid [facility] Boyd, MD; Church, PA; Jones, NP; Osborn, PA [address], [place],  [address] [phone] Mon-Fri 8:00-5:00 Babies seen by providers at [facility] Accepting Medicaid  [place] ([address]) [facility] [place] Wendling, DO [address] [place], [place] [address] [phone] Mon-Fri 8:00-5:00 Babies seen by [facility] providers Does NOT accept Medicaid Limited availability, please call early in hospitalization to schedule follow-up [facility] Calderon, PA; Cummings, MD; Dillard, MD; Martin, PA; Olson, MD; VanDeven, PA [address], [place], [place] [address] [phone] Mon-Fri 8:30-5:00, Sat 9:00-12:00 Babies seen by providers at [facility] Accepting Medicaid Please register online then schedule online or call office [email or web address] Hunter, NP; Kumar, MD; Martin Rogers, PA [address], [place], [place] [address] [phone] Mon-Fri 8:00-5:00 Babies seen by providers at [facility] Accepting Medicaid [facility] ([facility]) Ventura, MD; Kristi Fleenor, NP; West, MD [address], [place], [place] [address] [phone] Mon-Fri 8:00-5:30, Sat&Sun by appointment (phones open at  8:30) Babies seen by [facility] providers Accepting Medicaid Must be a first-time baby or sibling of current patient [facility] [place]  [address], [place], [place]  [address] [phone]   Fax - [phone]  [place] ([address]) [facility] Brown, PA; Cowen, PA; Rice, MD; Helton, PA; Spry, MD [address] [place], [place] [address] [phone] Mon-Thur 8:00-7:00, Fri 8:00-5:00, Sat 8:00-12:00, Sun 9:00-12:00 Babies seen by [facility] providers Accepting Medicaid [facility] Coe-Goins, MD; Marshall, MD; Pierre-Louis, MD [address], [place], [place] [address] [phone] Mon-Thur 8:00-5:00 Babies seen by providers at [facility] Accepting Medicaid [facility] Bratton, MD; Coe-Goins, MD; Hayes, MD; Lewis, MD; List, MD; Lott, MD; Marshall, MD; Moran, MD; O'Neal, MD; Pierre-Louis, MD; Pitonzo, MD; Scholer, MD; Spangle, MD [address] [place], [place] [address] [phone] Mon-Fri 8:00-5:30, Sat (Oct.-Mar.) 9:00-1:00 Babies seen by providers at [facility] Accepting Medicaid Must fill out new patient packet, available online at [email or web address] [facility] ([facility]) Friddle, NP; Harris, NP; Kelly, NP; Logan, MD; Melvin, PA; Poth, MD; Ramadoss, MD; Stanton, NP [address], [place], [place] [address] [phone] Mon-Thur 8:00-5:30, Fri 8:00-5:00 Babies seen by providers at [facility] Accepting Medicaid  [place] ([address]) [facility] Dixon, PA; [place], MD; Pickard, MD; Tapia, PA [address], [place], [place] [address] [phone] Mon-Fri 8:00-5:00 Babies seen by providers at [facility] Accepting Medicaid   [place] ([address]) [facility] [place] Masneri, DO; Meyers, MD; Nelson, PA [address], [place], [place] [address] [phone] Mon-Fri 8:00-5:00 Babies seen by providers at [facility] Does NOT accept Medicaid Limited appointment availability, please call early in hospitalization   [facility] [place] Kunedd, DO; McGowen, MD [address], [place],  [address] [phone] Mon-Fri 8:00-5:00 Babies seen by [facility] providers Does NOT accept Medicaid [facility] [place] Cameron, MD; MacDonald, MD; Michaels, PA; Nayak, MD [address] [place] [address], [place],  [address] [phone] Mon-Fri 8:00-5:00 After hours clinic ([address] [place],  [address]) [phone] Mon-Fri 5:00-8:00, Sat 12:00-6:00, Sun 10:00-4:00 Babies seen by [facility] providers Accepting Medicaid [facility] [place] [address], [place], [place]  [address] [phone]   Fax - [phone]  [place] ([address]) [facility] [place] [facility] Andy, MD [address], [place], [place] [address] [phone] Mon-Fri 8:00-5:00 Babies seen by [facility] providers Does NOT accept Medicaid [facility] [place] ([facility] [place]) Eksir, MD [address], [place], [place] [address] [phone] Mon-Thur 8:00-7:00, Fri 8:00-5:00, Sat 8:00-12:00 Babies seen by providers at [facility] Accepting Medicaid - but does not have vaccinations in office (must be received elsewhere) Limited availability, please call early in hospitalization  [place] ([address]) [facility]  Charlene Flemming, MD [address], [place] [address] [phone]  Fax [phone]  [facility]  Kimberly Newton, MD, Annamarie Streilein, PA, Carla Hampton, PA [address] [place], [place]  [address] [phone] [facility]  [address], [place], [place] [address] [phone] [address], [place], [place] [address] [phone] (West Office)  [facility] [address], [place], [place] [address] [phone] [facility] [address] [place], [place], [place] [address] [phone] [facility] [address], [place], [place] [address] [phone] [facility] [address], [place], [place] [address] [phone] [facility] [address], [place], [place] [address] [phone] [facility] [address], [place], [place] [address] [phone] [facility]  [address], [place], [place] [address] [phone] Dr[facility] [address] [place] [address], [place], [place] [address] [phone] [facility] [address], [place], [place] [address] [phone] [facility] [address], [place],  [address] [phone]  

## 2021-02-18 LAB — CYTOLOGY - PAP
Chlamydia: NEGATIVE
Comment: NEGATIVE
Comment: NEGATIVE
Comment: NORMAL
Diagnosis: NEGATIVE
Neisseria Gonorrhea: NEGATIVE
Trichomonas: NEGATIVE

## 2021-02-19 DIAGNOSIS — Z22330 Carrier of Group B streptococcus: Secondary | ICD-10-CM | POA: Insufficient documentation

## 2021-02-19 DIAGNOSIS — B951 Streptococcus, group B, as the cause of diseases classified elsewhere: Secondary | ICD-10-CM | POA: Insufficient documentation

## 2021-02-19 LAB — CULTURE, BETA STREP (GROUP B ONLY): Strep Gp B Culture: POSITIVE — AB

## 2021-02-21 ENCOUNTER — Encounter: Payer: Self-pay | Admitting: *Deleted

## 2021-02-21 ENCOUNTER — Ambulatory Visit (INDEPENDENT_AMBULATORY_CARE_PROVIDER_SITE_OTHER): Payer: BC Managed Care – PPO | Admitting: Obstetrics and Gynecology

## 2021-02-21 ENCOUNTER — Other Ambulatory Visit: Payer: Self-pay

## 2021-02-21 VITALS — BP 136/86 | HR 102 | Temp 98.2°F | Wt 225.0 lb

## 2021-02-21 DIAGNOSIS — O139 Gestational [pregnancy-induced] hypertension without significant proteinuria, unspecified trimester: Secondary | ICD-10-CM

## 2021-02-21 DIAGNOSIS — O0990 Supervision of high risk pregnancy, unspecified, unspecified trimester: Secondary | ICD-10-CM | POA: Diagnosis not present

## 2021-02-21 DIAGNOSIS — Z3A37 37 weeks gestation of pregnancy: Secondary | ICD-10-CM

## 2021-02-21 NOTE — Progress Notes (Signed)
  HIGH-RISK PREGNANCY OFFICE VISIT Patient name: Angelica Mills MRN [id number]  Date of birth: 2000/09/18 Chief Complaint:   Routine Prenatal Visit  History of Present Illness:   Angelica Mills is a 20 y.o. G81P0 female at 24w4dwith an Estimated Date of Delivery: 03/10/21 being seen today for ongoing management of a high-risk pregnancy complicated by gestational hypertension currently on no medication. Today she reports no complaints. Contractions: Not present. Vag. Bleeding: None.  Movement: Present. denies leaking of fluid.  Review of Systems:   Pertinent items are noted in HPI Denies abnormal vaginal discharge w/ itching/odor/irritation, headaches, visual changes, shortness of breath, chest pain, abdominal pain, severe nausea/vomiting, or problems with urination or bowel movements unless otherwise stated above. Pertinent History Reviewed:  Reviewed past medical,surgical, social, obstetrical and family history.  Reviewed problem list, medications and allergies. Physical Assessment:   Vitals:   02/21/21 1447 02/21/21 1450  BP: (!) 143/84 136/86  Pulse: (!) 108 (!) 102  Temp: 98.2 F (36.8 C)   Weight: 225 lb (102.1 kg)   Body mass index is 34.21 kg/m.           Physical Examination:   General appearance: alert, well appearing, and in no distress, oriented to person, place, and time, and overweight  Mental status: alert, oriented to person, place, and time, normal mood, behavior, speech, dress, motor activity, and thought processes  Skin: warm & dry   Extremities: Edema: None    Cardiovascular: normal heart rate noted  Respiratory: normal respiratory effort, no distress  Abdomen: gravid, soft, non-tender  Pelvic: Cervical exam deferred - per pt request        Fetal Status: Fetal Heart Rate (bpm): 154   Movement: Present    Fetal Surveillance Testing today: none   No results found for this or any previous visit (from the past 24 hour(s)).  Assessment & Plan:  1)  High-risk pregnancy G1P0 at 326w4dith an Estimated Date of Delivery: 03/10/21   2) Supervision of high risk pregnancy, antepartum  - Information provided on Balloon Catheter Placement  - IOL methods discussed r/b of IOL reviewed - Advised that natural remedies of inducing labor are not recommended for medically indicated IOL - Discussed the likelihood of H2O birth lessens with induction - IOL scheduled for 02/25/2021 (no availabilities before 37.6 wks)  3) Gestational hypertension, antepartum  - Comp Met (CMET),  - CBC,  - Protein / creatinine ratio, urine  4) [redacted] weeks gestation of pregnancy   Meds: No orders of the defined types were placed in this encounter.   Labs/procedures today: PEC labs  Treatment Plan: IOL scheduled  Reviewed: Term labor symptoms and general obstetric precautions including but not limited to vaginal bleeding, contractions, leaking of fluid and fetal movement were reviewed in detail with the patient.  All questions were answered. Has home bp cuff. Check bp weekly, let usKoreanow if >140/90.   Follow-up: Return in about 5 weeks (around 03/28/2021) for Postpartum Visit.  Orders Placed This Encounter  Procedures   Comp Met (CMET)   CBC   Protein / creatinine ratio, urine   RoLaury DeepSN, CNM 02/21/2021 3:23 PM

## 2021-02-21 NOTE — Patient Instructions (Signed)
Balloon Catheter Placement for Cervical Ripening Balloon catheter placement for cervical ripening is a procedure to help your cervix start to open (dilate) for birth. During this procedure, a thin tube (catheter) is placed through your cervix. A tiny balloon attached to the catheter is inflated with water and helps your cervix start to dilate. This procedure is done to prepare your body to induce, or bring on, labor. Cervical ripening witha balloon catheter can make labor induction shorter and easier. You may have this procedure if: Your cervix is not ready for labor. Your health care provider has planned labor induction. You are not having twins or multiples. Your baby is in the head-down position. You do not have any other pregnancy complications that require you to be monitored in the hospital after balloon catheter placement. If your health care provider has recommended labor induction to stimulate a vaginal birth, this procedure may be started the day before induction. You may go home with the balloon in place and return to the hospital to start induction in 12-24 hours. Or you may have this procedure and stay in the hospital so thatyour progress can be monitored. Tell a health care provider about: Any allergies you have. All medicines you are taking, including vitamins, herbs, eye drops, creams, and over-the-counter medicines. Any blood disorders you have. Any surgeries you have had. Any medical conditions you have. What are the risks? Generally, this is a safe procedure. However, problems may occur, including: Infection. Bleeding. Cramping or pain. Difficulty passing urine. The baby moving from the head-down position to a position with the feet or buttocks down (breech position). What happens before the procedure? Your health care provider may check your baby's heartbeat (fetal monitoring) before the procedure. You may be asked to empty your bladder. What happens during the  procedure?  You will be positioned on the exam table as if you were having a pelvic exam or Pap test. Your health care provider may insert a medical instrument into your vagina (speculum) to see your cervix. Your cervix may be cleaned with a germ-killing solution. The catheter will be inserted through the opening of your cervix. A balloon on the end of the catheter will be inflated with germ-free (sterile) water. Some catheters have two balloons, one on each side of the cervix. Depending on the type of balloon catheter, the end of the catheter may be left free outside your cervix or taped to your leg. The procedure may vary among health care providers and hospitals. What can I expect after the procedure? After the procedure, it is common to have: A feeling of pressure inside your vagina. Light vaginal bleeding (spotting). You may have fetal monitoring before going home. You may be sent home with the catheter in place and asked to return to start your labor induction in about 12-24 hours. Follow these instructions at home: Take over-the-counter and prescription medicines only as told by your health care provider. Do not leave home until you return to the hospital for labor induction. Return to your normal activities at home as told by your health care provider. Ask your health care provider what activities are safe for you. Do not take baths, swim, or use a hot tub unless your health care provider approves. You may shower at home. As your cervix dilates, your catheter and balloon may fall out before you return for labor induction. Ask your health care provider what you should do if this happens. You will need to return to start induction as  told by your health care provider. Keep all follow-up visits. This is important. Contact your health care provider if: You have chills or a fever. You have constant pain or cramps that are not contractions. You have trouble passing urine. Your water  breaks. You have vaginal bleeding that is heavier than spotting. You have contractions that start to last longer and come closer together (about every 5 minutes). The balloon catheter falls out before you return to start your induction. Summary Cervical ripening with a balloon catheter helps your cervix start to open (dilate) for birth. Pressure from the balloon will cause ripening of your cervix. You may go home with the balloon in place and return to start induction in 12-24 hours, or you may stay in the hospital while the balloon is in place. Follow your health care provider's instructions for when to call for help or return to the hospital. This information is not intended to replace advice given to you by your health care provider. Make sure you discuss any questions you have with your healthcare provider. Document Revised: 04/05/2020 Document Reviewed: 04/05/2020 Elsevier Patient Education  2022 [employer].

## 2021-02-22 ENCOUNTER — Other Ambulatory Visit: Payer: Self-pay | Admitting: Obstetrics and Gynecology

## 2021-02-22 LAB — CBC
Hematocrit: 33.9 % — ABNORMAL LOW (ref 34.0–46.6)
Hemoglobin: 11.2 g/dL (ref 11.1–15.9)
MCH: 25.5 pg — ABNORMAL LOW (ref 26.6–33.0)
MCHC: 33 g/dL (ref 31.5–35.7)
MCV: 77 fL — ABNORMAL LOW (ref 79–97)
Platelets: 331 10*3/uL (ref 150–450)
RBC: 4.39 x10E6/uL (ref 3.77–5.28)
RDW: 12.7 % (ref 11.7–15.4)
WBC: 8.8 10*3/uL (ref 3.4–10.8)

## 2021-02-22 LAB — COMPREHENSIVE METABOLIC PANEL
ALT: 7 IU/L (ref 0–32)
AST: 12 IU/L (ref 0–40)
Albumin/Globulin Ratio: 1.1 — ABNORMAL LOW (ref 1.2–2.2)
Albumin: 3.5 g/dL — ABNORMAL LOW (ref 3.9–5.0)
Alkaline Phosphatase: 122 IU/L — ABNORMAL HIGH (ref 44–121)
BUN/Creatinine Ratio: 13 (ref 9–23)
BUN: 7 mg/dL (ref 6–20)
Bilirubin Total: <0.2 mg/dL (ref 0.0–1.2)
CO2: 22 mmol/L (ref 20–29)
Calcium: 9.4 mg/dL (ref 8.7–10.2)
Chloride: 102 mmol/L (ref 96–106)
Creatinine, Ser: 0.53 mg/dL — ABNORMAL LOW (ref 0.57–1.00)
Globulin, Total: 3.2 g/dL (ref 1.5–4.5)
Glucose: 98 mg/dL (ref 65–99)
Potassium: 4 mmol/L (ref 3.5–5.2)
Sodium: 137 mmol/L (ref 134–144)
Total Protein: 6.7 g/dL (ref 6.0–8.5)
eGFR: 135 mL/min/{1.73_m2} (ref >59)

## 2021-02-22 LAB — PROTEIN / CREATININE RATIO, URINE
Creatinine, Urine: 35.5 mg/dL
Protein, Ur: <4 mg/dL

## 2021-02-22 NOTE — Progress Notes (Signed)
IOL orders entered 

## 2021-02-24 ENCOUNTER — Encounter: Payer: Self-pay | Admitting: Advanced Practice Midwife

## 2021-02-24 DIAGNOSIS — O133 Gestational [pregnancy-induced] hypertension without significant proteinuria, third trimester: Secondary | ICD-10-CM | POA: Insufficient documentation

## 2021-02-25 ENCOUNTER — Encounter (HOSPITAL_COMMUNITY): Payer: Self-pay | Admitting: Family Medicine

## 2021-02-25 ENCOUNTER — Inpatient Hospital Stay (HOSPITAL_COMMUNITY): Payer: BC Managed Care – PPO

## 2021-02-25 ENCOUNTER — Other Ambulatory Visit: Payer: Self-pay

## 2021-02-25 ENCOUNTER — Inpatient Hospital Stay (HOSPITAL_COMMUNITY)
Admission: AD | Admit: 2021-02-25 | Discharge: 2021-02-28 | DRG: 807 | Disposition: A | Discharge status: Home / Self Care | Payer: BC Managed Care – PPO | Attending: Obstetrics and Gynecology | Admitting: Obstetrics and Gynecology

## 2021-02-25 DIAGNOSIS — O99824 Streptococcus B carrier state complicating childbirth: Secondary | ICD-10-CM | POA: Diagnosis present

## 2021-02-25 DIAGNOSIS — Z3A38 38 weeks gestation of pregnancy: Secondary | ICD-10-CM

## 2021-02-25 DIAGNOSIS — O134 Gestational [pregnancy-induced] hypertension without significant proteinuria, complicating childbirth: Secondary | ICD-10-CM | POA: Diagnosis present

## 2021-02-25 DIAGNOSIS — B951 Streptococcus, group B, as the cause of diseases classified elsewhere: Secondary | ICD-10-CM

## 2021-02-25 DIAGNOSIS — O0990 Supervision of high risk pregnancy, unspecified, unspecified trimester: Secondary | ICD-10-CM

## 2021-02-25 DIAGNOSIS — Z22330 Carrier of Group B streptococcus: Secondary | ICD-10-CM | POA: Diagnosis present

## 2021-02-25 DIAGNOSIS — O139 Gestational [pregnancy-induced] hypertension without significant proteinuria, unspecified trimester: Secondary | ICD-10-CM | POA: Diagnosis present

## 2021-02-25 DIAGNOSIS — Z20822 Contact with and (suspected) exposure to covid-19: Secondary | ICD-10-CM | POA: Diagnosis present

## 2021-02-25 HISTORY — DX: Gestational (pregnancy-induced) hypertension without significant proteinuria, unspecified trimester: O13.9

## 2021-02-25 LAB — TYPE AND SCREEN
ABO/RH(D): O POS
Antibody Screen: NEGATIVE

## 2021-02-25 LAB — CBC
HCT: 35.3 % — ABNORMAL LOW (ref 36.0–46.0)
Hemoglobin: 11 g/dL — ABNORMAL LOW (ref 12.0–15.0)
MCH: 25.2 pg — ABNORMAL LOW (ref 26.0–34.0)
MCHC: 31.2 g/dL (ref 30.0–36.0)
MCV: 80.8 fL (ref 80.0–100.0)
Platelets: 331 10*3/uL (ref 150–400)
RBC: 4.37 MIL/uL (ref 3.87–5.11)
RDW: 13 % (ref 11.5–15.5)
WBC: 9.9 10*3/uL (ref 4.0–10.5)
nRBC: 0 % (ref 0.0–0.2)

## 2021-02-25 LAB — PROTEIN / CREATININE RATIO, URINE
Creatinine, Urine: 27.85 mg/dL
Total Protein, Urine: <6 mg/dL

## 2021-02-25 LAB — SARS CORONAVIRUS 2 (TAT 6-24 HRS): SARS Coronavirus 2: NEGATIVE

## 2021-02-25 MED ORDER — OXYTOCIN BOLUS FROM INFUSION
333.0000 mL | Freq: Once | INTRAVENOUS | Status: AC
  Administered 2021-02-26: 333 mL/h via INTRAVENOUS

## 2021-02-25 MED ORDER — TERBUTALINE SULFATE 1 MG/ML IJ SOLN: 0.2500 mg | Freq: Once | INTRAMUSCULAR | Status: DC | PRN

## 2021-02-25 MED ORDER — LIDOCAINE HCL (PF) 1 % IJ SOLN: 30.0000 mL | INTRAMUSCULAR | Status: DC | PRN

## 2021-02-25 MED ORDER — MISOPROSTOL 50MCG HALF TABLET
50.0000 ug | ORAL_TABLET | ORAL | Status: DC
  Administered 2021-02-25 – 2021-02-26 (×4): 50 ug via BUCCAL
  Filled 2021-02-25 (×4): qty 1

## 2021-02-25 MED ORDER — PENICILLIN G POT IN DEXTROSE 60000 UNIT/ML IV SOLN
3.0000 10*6.[IU] | INTRAVENOUS | Status: DC
  Administered 2021-02-25 – 2021-02-26 (×4): 3 10*6.[IU] via INTRAVENOUS
  Filled 2021-02-25 (×4): qty 50

## 2021-02-25 MED ORDER — SODIUM CHLORIDE 0.9 % IV SOLN
5.0000 10*6.[IU] | Freq: Once | INTRAVENOUS | Status: AC
  Administered 2021-02-25: 5 10*6.[IU] via INTRAVENOUS
  Filled 2021-02-25: qty 5

## 2021-02-25 MED ORDER — HYDROXYZINE HCL 50 MG PO TABS: 50.0000 mg | ORAL_TABLET | Freq: Four times a day (QID) | ORAL | Status: DC | PRN

## 2021-02-25 MED ORDER — OXYTOCIN-SODIUM CHLORIDE 30-0.9 UT/500ML-% IV SOLN: 1.0000 m[IU]/min | INTRAVENOUS | Status: DC

## 2021-02-25 MED ORDER — PENICILLIN G POTASSIUM 5000000 UNITS IJ SOLR
5.0000 10*6.[IU] | Freq: Once | INTRAMUSCULAR | Status: DC
  Filled 2021-02-25: qty 5

## 2021-02-25 MED ORDER — CLINDAMYCIN PHOSPHATE 900 MG/50ML IV SOLN: 900.0000 mg | Freq: Three times a day (TID) | INTRAVENOUS | Status: DC

## 2021-02-25 MED ORDER — PENICILLIN G POTASSIUM 5000000 UNITS IJ SOLR: 5.0000 10*6.[IU] | Freq: Once | INTRAMUSCULAR | Status: DC

## 2021-02-25 MED ORDER — PENICILLIN G POTASSIUM 5000000 UNITS IJ SOLR: 3.0000 10*6.[IU] | INTRAMUSCULAR | Status: DC

## 2021-02-25 MED ORDER — FENTANYL CITRATE (PF) 100 MCG/2ML IJ SOLN: 100.0000 ug | INTRAMUSCULAR | Status: DC | PRN

## 2021-02-25 MED ORDER — ACETAMINOPHEN 325 MG PO TABS: 650.0000 mg | ORAL_TABLET | ORAL | Status: DC | PRN

## 2021-02-25 MED ORDER — PENICILLIN G POTASSIUM 5000000 UNITS IJ SOLR
3.0000 10*6.[IU] | INTRAMUSCULAR | Status: DC
  Filled 2021-02-25: qty 5

## 2021-02-25 MED ORDER — OXYTOCIN-SODIUM CHLORIDE 30-0.9 UT/500ML-% IV SOLN
2.5000 [IU]/h | INTRAVENOUS | Status: DC
  Filled 2021-02-25 (×2): qty 500

## 2021-02-25 MED ORDER — LACTATED RINGERS IV SOLN: 500.0000 mL | INTRAVENOUS | Status: DC | PRN

## 2021-02-25 MED ORDER — LACTATED RINGERS IV SOLN
INTRAVENOUS | Status: DC
Start: 2021-02-25 — End: 2021-02-26

## 2021-02-25 MED ORDER — ONDANSETRON HCL 4 MG/2ML IJ SOLN: 4.0000 mg | Freq: Four times a day (QID) | INTRAMUSCULAR | Status: DC | PRN

## 2021-02-25 NOTE — Progress Notes (Signed)
Labor Progress Note Angelica Mills is a 20 y.o. G1P0 at [redacted]w[redacted]d presented for IOL due to gHTN.  S: She states that she is starting to feel her contractions more strongly now. She is still feeling well overall. Does not desire a foley at this time, but would be open to one at next cervical check around 2000.   O:  BP 120/74   Pulse 87   Temp 98.5 F (36.9 C) (Oral)   Resp 18   Ht 5\' 8"  (1.727 m)   Wt 102.1 kg   LMP 06/03/2020   BMI 34.21 kg/m  EFM: 140/moderate/acceleration present  CVE: Dilation: 1.5 Effacement (%): 50 Cervical Position: Middle Station: -2 Presentation: Vertex Exam by:: [place]ima, rn   A&P: 20 y.o. G1P0 [redacted]w[redacted]d here for IOL due to gHTN #Labor: Progressing well. Patient received cytotec at 1600. If <3cm at next check, she would be willing to have a foley placed.  #Pain: Well-controlled. Planning epidural eventually. #FWB: Cat I #GBS positive, PCN running #gHTN on no medication, last BP 120/74  [redacted]w[redacted]d, MD 5:59 PM

## 2021-02-25 NOTE — H&P (Addendum)
OBSTETRIC ADMISSION HISTORY AND PHYSICAL  Angelica Mills is a 20 y.o. female G1P0 with IUP at 44w1dby LMP [facility] She reports +FMs, No LOF, no VB, no blurry vision, headaches or peripheral edema, and RUQ pain.  She plans on breast feeding. She is undecided on birth control currently, will reevaluate at post partum visit. [facility]    Dating: By LMP --->  Estimated Date of Delivery: 03/10/21  Sono:    @[redacted]w[redacted]d , CWD, normal anatomy, cephalic presentation, anterior placenta lie, 630g, 17% EFW   Prenatal History/Complications:  -GHTN -GBS positive  Past Medical History: Past Medical History:  Diagnosis Date   Medical history non-contributory     Past Surgical History: Past Surgical History:  Procedure Laterality Date   NO PAST SURGERIES      Obstetrical History: OB History     Gravida  1   Para      Term      Preterm      AB      Living         SAB      IAB      Ectopic      Multiple      Live Births  0           Social History Social History   Socioeconomic History   Marital status: Single    Spouse name: [name]   Number of children: Not on file   Years of education: current 3rd year   Highest education level: Some college, no degree  Occupational History   Not on file  Tobacco Use   Smoking status: Never   Smokeless tobacco: Never  Vaping Use   Vaping Use: Never used  Substance and Sexual Activity   Alcohol use: Not Currently    Comment: rare   Drug use: Never   Sexual activity: Yes  Other Topics Concern   Not on file  Social History Narrative   Not on file   Social Determinants of Health   Financial Resource Strain: Not on file  Food Insecurity: Not on file  Transportation Needs: Not on file  Physical Activity: Not on file  Stress: Not on file  Social Connections: Not on file    Family History: Family History  Problem Relation Age of Onset   Hypertension  Father     Allergies: No Known Allergies  Medications Prior to Admission  Medication Sig Dispense Refill Last Dose   blood glucose meter kit and supplies KIT Dispense based on patient and insurance preference. Use up to four times daily as directed. 1 each 0    Prenatal Vit-Fe Fumarate-FA (PRENATAL VITAMINS PO) Take 2 tablets by mouth daily at 6 (six) AM.        Review of Systems   All systems reviewed and negative except as stated in HPI  Blood pressure 137/88, pulse (!) 109, temperature 98.1 F (36.7 C), temperature source Oral, resp. rate 18, height 5' 8"  (1.727 m), weight 102.1 kg, last menstrual period 06/03/2020. General appearance: alert, cooperative, and no distress Extremities: no sign of DVT Presentation: cephalic Fetal monitoringBaseline: 155 bpm, Variability: Good {> 6 bpm), Accelerations: Reactive, and Decelerations: Absent Uterine activity: Patient experiencing some contractions, variable on tracing  Dilation: Closed Effacement (%): Thick Station:  (high) Exam by:: Dr SJoelyn Oms  Prenatal labs: ABO, Rh: --/--/PENDING (08/22 1042) Antibody: PENDING (08/22 1042) Rubella: 1.31 (02/16 1027) RPR: Non Reactive (06/17  6979)  HBsAg: Negative (02/16 1027)  HIV: Non Reactive (06/17 0917)  GBS: Positive/-- (08/12 0954)  1 hr Glucola declined Genetic screening  NIPS low risk, AFP negative Anatomy US normal  Nursing Staff Provider  Office Location  [facility] Dating  LMP  Language  English Anatomy US  Normal  Flu Vaccine   Genetic Screen  NIPS: Low risk girl  AFP: Negative  First Screen:  Quad:    TDaP Vaccine    Hgb A1C or  GTT Early 5.1>>3rd trimester 5.5 Third trimester DECLINED  COVID Vaccine 2 doses   LAB RESULTS   Rhogam  N/A Blood Type O/Positive/-- (02/16 1027)   Feeding Plan Breast  Antibody Negative (02/16 1027)  Contraception unsure Rubella 1.31 (02/16 1027) IMMUNE  Circumcision N/A RPR Non Reactive (06/17 0917)   Pediatrician  Info given HBsAg  Negative (02/16 1027)   Support Person Husband - [name] HCVAb Negative  Prenatal Classes No HIV Non Reactive (06/17 0917)     BTL Consent N/A GBS Positive/-- (08/12 0954) (For PCN allergy, check sensitivities)   VBAC Consent N/A Pap @ 36 wks     Hgb Electro  Negative  BP Cuff Given CF Negative    SMA Negative    Waterbirth  [x]  Class [ ]  Consent [x]  CNM visit    Induction  [ ]  Orders Entered [ ] Foley Y/N    Prenatal Transfer Tool  Maternal Diabetes: Declined testing Genetic Screening: Normal Maternal Ultrasounds/Referrals: Normal Fetal Ultrasounds or other Referrals:  None Maternal Substance Abuse:  No Significant Maternal Medications:  None Significant Maternal Lab Results: Group B Strep positive  Results for orders placed or performed during the hospital encounter of 02/25/21 (from the past 24 hour(s))  Type and screen   Collection Time: 02/25/21 10:42 AM  Result Value Ref Range   ABO/RH(D) PENDING    Antibody Screen PENDING    Sample Expiration      02/28/2021,2359 Performed at [facility], [address], [place], [place] [address]     Patient Active Problem List   Diagnosis Date Noted   Gestational hypertension affecting first pregnancy 02/25/2021   Gestational hypertension, third trimester 02/24/2021   Positive GBS test 02/19/2021   Supervision of high risk pregnancy, antepartum 08/22/2020    Assessment/Plan:  Angelica Mills is a 20 y.o. G1P0 at 30w1dhere [facility]  #Labor: Patient was closed and thick on cervical exam, will start Cytotec. Depending on progress after next check will consider foley bulb placement. #Pain: PRN, does not want epidural at this moment #FWB: Cat 1 #ID:  GBS positive, treating with PCN #MOF: breast feeding #MOC: unsure, will have decision at postpartum visit. #Circ:  N/a, baby girl #GHTN: Blood pressures not in severe range on admission, will continue to monitor.  GTyna Jaksch Medical Student   02/25/2021, 11:21 AM     Attestation of Supervision of Student:  I confirm that I have verified the information documented in the medical student's note and that I have also personally reperformed the history, physical exam and all medical decision making activities.  I have verified that all services and findings are accurately documented in this student's note; and I agree with management and plan as outlined in the documentation. I have also made any necessary editorial changes.  HMarcille BuffyDNP, CNM  02/25/21  11:33 AM

## 2021-02-25 NOTE — Progress Notes (Signed)
Labor Progress Note Angelica Mills is a 20 y.o. G1P0 at [redacted]w[redacted]d presented for IOL due to gHTN. S: Indicates feeling well, feeling contractions more but not significant pain.  O:  BP 127/83   Pulse 88   Temp 98.9 F (37.2 C) (Oral)   Resp 20   Ht 5\' 8"  (1.727 m)   Wt 102.1 kg   LMP 06/03/2020   BMI 34.21 kg/m  EFM: 140/Moderate/ +Accels, -Deccels  CVE: Dilation: 2.5 Effacement (%): 50 Cervical Position: Middle Station: -2 Presentation: Vertex Exam by:: [email or web address], RN   A&P: 20 y.o. G1P0 [redacted]w[redacted]d here for IOL due to gHTN. #Labor: Progressing well.  Given 3rd dose of Cytotec.  Placed [name] catheter. #Pain: Epidural PRN. #FWB: Cat 1 #GBS positive, PCN running #gHTN on no medication, well controlled, last BP 127/83.   [redacted]w[redacted]d, MD 9:49 PM

## 2021-02-26 ENCOUNTER — Encounter (HOSPITAL_COMMUNITY): Payer: Self-pay | Admitting: Family Medicine

## 2021-02-26 DIAGNOSIS — O99824 Streptococcus B carrier state complicating childbirth: Secondary | ICD-10-CM

## 2021-02-26 DIAGNOSIS — Z3A38 38 weeks gestation of pregnancy: Secondary | ICD-10-CM

## 2021-02-26 DIAGNOSIS — O134 Gestational [pregnancy-induced] hypertension without significant proteinuria, complicating childbirth: Secondary | ICD-10-CM

## 2021-02-26 LAB — RPR: RPR Ser Ql: NONREACTIVE

## 2021-02-26 MED ORDER — DIPHENHYDRAMINE HCL 25 MG PO CAPS: 25.0000 mg | ORAL_CAPSULE | Freq: Four times a day (QID) | ORAL | Status: DC | PRN

## 2021-02-26 MED ORDER — ZOLPIDEM TARTRATE 5 MG PO TABS: 5.0000 mg | ORAL_TABLET | Freq: Every evening | ORAL | Status: DC | PRN

## 2021-02-26 MED ORDER — ONDANSETRON HCL 4 MG PO TABS: 4.0000 mg | ORAL_TABLET | ORAL | Status: DC | PRN

## 2021-02-26 MED ORDER — DIBUCAINE (PERIANAL) 1 % EX OINT: 1.0000 "application " | TOPICAL_OINTMENT | CUTANEOUS | Status: DC | PRN

## 2021-02-26 MED ORDER — MEASLES, MUMPS & RUBELLA VAC IJ SOLR: 0.5000 mL | Freq: Once | INTRAMUSCULAR | Status: DC

## 2021-02-26 MED ORDER — SIMETHICONE 80 MG PO CHEW: 80.0000 mg | CHEWABLE_TABLET | ORAL | Status: DC | PRN

## 2021-02-26 MED ORDER — SENNOSIDES-DOCUSATE SODIUM 8.6-50 MG PO TABS
2.0000 | ORAL_TABLET | ORAL | Status: DC
  Filled 2021-02-26 (×2): qty 2

## 2021-02-26 MED ORDER — TETANUS-DIPHTH-ACELL PERTUSSIS 5-2.5-18.5 LF-MCG/0.5 IM SUSY
0.5000 mL | PREFILLED_SYRINGE | Freq: Once | INTRAMUSCULAR | Status: DC
  Filled 2021-02-26: qty 0.5

## 2021-02-26 MED ORDER — ONDANSETRON HCL 4 MG/2ML IJ SOLN: 4.0000 mg | INTRAMUSCULAR | Status: DC | PRN

## 2021-02-26 MED ORDER — ACETAMINOPHEN 325 MG PO TABS
650.0000 mg | ORAL_TABLET | ORAL | Status: DC | PRN
Start: 2021-02-26 — End: 2021-02-28

## 2021-02-26 MED ORDER — OXYCODONE HCL 5 MG PO TABS: 5.0000 mg | ORAL_TABLET | ORAL | Status: DC | PRN

## 2021-02-26 MED ORDER — WITCH HAZEL-GLYCERIN EX PADS: 1.0000 "application " | MEDICATED_PAD | CUTANEOUS | Status: DC | PRN

## 2021-02-26 MED ORDER — BENZOCAINE-MENTHOL 20-0.5 % EX AERO
1.0000 "application " | INHALATION_SPRAY | CUTANEOUS | Status: DC | PRN
  Administered 2021-02-26: 1 via TOPICAL
  Filled 2021-02-26: qty 56

## 2021-02-26 MED ORDER — COCONUT OIL OIL: 1.0000 "application " | TOPICAL_OIL | Status: DC | PRN

## 2021-02-26 MED ORDER — IBUPROFEN 600 MG PO TABS
600.0000 mg | ORAL_TABLET | Freq: Four times a day (QID) | ORAL | Status: DC
Start: 2021-02-26 — End: 2021-02-28
  Administered 2021-02-26 – 2021-02-27 (×4): 600 mg via ORAL
  Filled 2021-02-26 (×5): qty 1

## 2021-02-26 MED ORDER — PRENATAL MULTIVITAMIN CH
1.0000 | ORAL_TABLET | Freq: Every day | ORAL | Status: DC
  Filled 2021-02-26: qty 1

## 2021-02-26 NOTE — Progress Notes (Signed)
Patient Vitals for the past 4 hrs:  BP Temp Temp src Pulse Resp  02/26/21 0645 126/80 -- -- 78 17  02/26/21 0451 126/82 -- -- 89 17  02/26/21 0400 -- 98.6 F (37 C) Oral -- --   Ctx are about the same. No change in cx. Pt amenable to pitocin if AROM doesn't augment labor.  AROM w/clear fluid.  Will recheck in a few hours or prn.

## 2021-02-26 NOTE — Progress Notes (Signed)
Labor Progress Note Angelica Mills is a 20 y.o. G1P0 at [redacted]w[redacted]d presented for IOL due to gHTN. S: Indicates feeling more comfortable now that [name] catheter out.  O:  BP 133/79   Pulse 83   Temp 99.1 F (37.3 C) (Oral)   Resp 16   Ht 5\' 8"  (1.727 m)   Wt 102.1 kg   LMP 06/03/2020   BMI 34.21 kg/m  EFM: 140/Moderate/ + Accels, - Deccels  CVE: Dilation: 6 Effacement (%): 60 Cervical Position: Middle Station: -2 Presentation: Vertex Exam by:: [email or web address], RN   A&P: 20 y.o. G1P0 [redacted]w[redacted]d here for IOL due to gHTN #Labor: Progressing well. [name] catheter out.  Patient resistant to starting Pitocin.  Will attempt breast pumping and working  with nurse on developing pattern and  #Pain: Fentanyl PRN.  Declines epidural. #FWB: Cat 1 #GBS positive, PCN running #gHTN on no medication, last BP 120/74  [redacted]w[redacted]d, MD 12:19 AM

## 2021-02-26 NOTE — Progress Notes (Signed)
Patient ID: Angelica Mills, female   DOB: [birth year], 20 y.o.   MRN: [id number]  In H&K position; feeling a lot of pressure w ctx and an urge to push; s/p AROM @ 0700; has rec'd adequate PCN  BPs 144/73, 127/88 FHR 120s, +accels, no decels, occ variables Ctx q 2-3 mins, spont Cx w stretchy ant lip/vtx 0  IUP@38 .2wks gHTN Active labor GBS pos  Begin pushing w ctx Anticipate vag del  [name] [facility] 02/26/2021

## 2021-02-26 NOTE — Discharge Summary (Addendum)
Postpartum Discharge Summary      Patient Name: Angelica Mills DOB: 2001-02-21 MRN: [id number]  Date of admission: 02/25/2021 Delivery date:02/26/2021  Delivering provider: Serita Grammes D  Date of discharge: 02/28/2021  Admitting diagnosis: Gestational hypertension affecting first pregnancy [O13.9] Intrauterine pregnancy: [redacted]w[redacted]d    Secondary diagnosis:  Active Problems:   Positive GBS test   Gestational hypertension affecting first pregnancy  Additional problems: none    Discharge diagnosis: Term Pregnancy Delivered and Gestational Hypertension                                              Post partum procedures: none Augmentation: AROM, Cytotec, and IP Foley Complications: None  Hospital course: Induction of Labor With Vaginal Delivery   20y.o. yo G1P0 at 374w2das admitted to the hospital 02/25/2021 for induction of labor.  Indication for induction: Gestational hypertension.  Patient had a labor course remarkable for mild range BP elevations and neg urine P/C ratio, progressing to vag del after the usual cx ripening measures and AROM. She received adequate PCN for GBS ppx. Membrane Rupture Time/Date: 7:00 AM ,02/26/2021   Delivery Method:Vaginal, Spontaneous  Episiotomy: None  Lacerations:  Labial  Details of delivery can be found in separate delivery note.  Patient had a routine postpartum course. Patient is discharged home 02/28/21.  Newborn Data: Birth date:02/26/2021  Birth time:12:35 PM  Gender:Female  Living status:Living  Apgars:9 ,9  Weight:2784 g (6lb 2.2oz)  Magnesium Sulfate received: No BMZ received: No Rhophylac:N/A MMR:N/A T-DaP: offered postpartum Flu: N/A Transfusion:No  Physical exam  Vitals:   02/27/21 0439 02/27/21 1432 02/27/21 2221 02/28/21 0550  BP: 109/63 108/66 119/67 129/83  Pulse: 92 78 85 68  Resp: _0 Temp: 98.5 F (36.9 C) 98 F (36.7 C) 98.6 F (37 C) 98.4 F (36.9 C)  TempSrc: Oral Oral Oral Oral  SpO2: 100%   98% 100%  Weight:      Height:       General: alert, cooperative, and no distress Lochia: appropriate Uterine Fundus: firm DVT Evaluation: No evidence of DVT seen on physical exam. Labs: Lab Results  Component Value Date   WBC 9.9 02/25/2021   HGB 11.0 (L) 02/25/2021   HCT 35.3 (L) 02/25/2021   MCV 80.8 02/25/2021   PLT 331 02/25/2021   CMP Latest Ref Rng & Units 02/21/2021  Glucose 65 - 99 mg/dL 98  BUN 6 - 20 mg/dL 7  Creatinine 0.57 - 1.00 mg/dL 0.53(L)  Sodium 134 - 144 mmol/L 137  Potassium 3.5 - 5.2 mmol/L 4.0  Chloride 96 - 106 mmol/L 102  CO2 20 - 29 mmol/L 22  Calcium 8.7 - 10.2 mg/dL 9.4  Total Protein 6.0 - 8.5 g/dL 6.7  Total Bilirubin 0.0 - 1.2 mg/dL <0.2  Alkaline Phos 44 - 121 IU/L 122(H)  AST 0 - 40 IU/L 12  ALT 0 - 32 IU/L 7   Edinburgh Score: Edinburgh Postnatal Depression Scale Screening Tool 02/27/2021  I have been able to laugh and see the funny side of things. 1  I have looked forward with enjoyment to things. 0  I have blamed myself unnecessarily when things went wrong. 2  I have been anxious or worried for no good reason. 2  I have felt scared or panicky for no good reason. 1  Things  have been getting on top of me. 2  I have been so unhappy that I have had difficulty sleeping. 1  I have felt sad or miserable. 1  I have been so unhappy that I have been crying. 1  The thought of harming myself has occurred to me. 0  Edinburgh Postnatal Depression Scale Total 11     After visit meds:  Allergies as of 02/28/2021   No Known Allergies      Medication List     STOP taking these medications    blood glucose meter kit and supplies Kit   zinc gluconate 50 MG tablet       TAKE these medications    acetaminophen 325 MG tablet Commonly known as: Tylenol Take 2 tablets (650 mg total) by mouth every 4 (four) hours as needed (for pain scale < 4).   ibuprofen 200 MG tablet Commonly known as: ADVIL Take 3 tablets (600 mg total) by mouth  every 6 (six) hours as needed.   PRENATAL VITAMINS PO Take 2 tablets by mouth daily at 6 (six) AM.   Probiotic 250 MG Caps Take 250 mg by mouth daily.       Discharge home in stable condition Infant Feeding: Bottle and Breast Infant Disposition:home with mother Discharge instruction: per After Visit Summary and Postpartum booklet. Activity: Advance as tolerated. Pelvic rest for 6 weeks.  Diet: routine diet Future Appointments: Future Appointments  Date Time Provider [place]  03/05/2021  9:50 AM [place] None  03/27/2021 10:35 AM Gabriel Carina, CNM [facility] None   Follow up Visit:  Myrtis Ser, CNM  P [facility] Admin Please schedule this patient for Postpartum visit in: 4 weeks with the following provider: Any provider  In-Person  For C/S patients schedule nurse incision check in weeks 2 weeks: no  High risk pregnancy complicated by: gHTN  Delivery mode:  SVD  Anticipated Birth Control:  other/unsure  PP Procedures needed: BP check in 1wk  Schedule Integrated [place] visit: no    02/28/2021 Pearla Dubonnet, MD  Midwife attestation I have seen and examined this patient and agree with above documentation in the resident's note.   Angelica Mills is a 20 y.o. G1P1001 s/p SVD.  Pain is well controlled. Plan for birth control is  undecided . Method of Feeding: breast  PE:  Gen: well appearing Heart: reg rate Lungs: normal WOB Fundus firm Ext: no pain, no edema  Assessment S/p SVD PPD # 2 gHTN: stable  Plan: - discharge home - postpartum care discussed - f/u in office in 6 weeks for postpartum visit - f/u in 1 week for BP check   Julianne Handler, CNM 11:11 AM

## 2021-02-26 NOTE — Lactation Note (Signed)
This note was copied from a baby's chart. Lactation Consultation Note  Patient Name: Angelica Mills Today's Date: 02/26/2021 Reason for consult: Initial assessment;1st time breastfeeding;Early term 37-38.6wks Age:21 hours Per mom, infant has not been sustain latch, she has been finger feeding infant a few drops of colostrum and doing a lot of skin to skin. LC assisted mom in pre-pumping her breast to help evert her nipple shaft out more prior to latching infant, mom is short shafted. Mom latched infant on her right breast using the football hold position, half way through the feeding infant started sustaining the latch and infant breastfeed for 15 minutes.  Afterwards mom used the hand pump and infant was given 6 mls of colostrum that was spoon feed to infant. LC discussed with parents infant feeding plan can change based on infant's feeding behaviors. Mom shown how to use DEBP & how to disassemble, clean, & reassemble parts.  Mom made aware of O/P services, breastfeeding support groups, community resources, and our phone # for post-discharge questions.   Mom's plans: 1- Mom will breastfeed infant according to feeding cues, 8 to 12+ times within 24 hours, skin to skin. 2- Mom knows to call RN or LC if she needs assistance with latching infant at the breast. 3- Mom will continue to pumped every 3 hours for 15 minutes and give infant back any EBM that is pumped.   Maternal Data Has patient been taught Hand Expression?: Yes Does the patient have breastfeeding experience prior to this delivery?: No  Feeding Mother's Current Feeding Choice: Breast Milk  LATCH Score Latch: Repeated attempts needed to sustain latch, nipple held in mouth throughout feeding, stimulation needed to elicit sucking reflex.  Audible Swallowing: A few with stimulation  Type of Nipple: Everted at rest and after stimulation (Mom is short shafted, she will pre-pump breast prior to latching infant.)  Comfort  (Breast/Nipple): Soft / non-tender  Hold (Positioning): Assistance needed to correctly position infant at breast and maintain latch.  LATCH Score: 7   Lactation Tools Discussed/Used Tools: Pump Breast pump type: Double-Electric Breast Pump Pump Education: Setup, frequency, and cleaning;Milk Storage Reason for Pumping: Moom's choice to use DEBP Pumping frequency: Mom will pump every 3 hours for 15 minutes on inital setting. Pumped volume: 6 mL  Interventions Interventions: Breast feeding basics reviewed;Assisted with latch;Skin to skin;Breast massage;Hand express;Pre-pump if needed;Breast compression;Adjust position;Support pillows;Position options;Expressed milk;Education;DEBP;Hand pump  Discharge Pump: Personal;DEBP WIC Program: No  Consult Status Consult Status: Follow-up Date: 02/27/21 Follow-up type: In-patient    Angelica Mills 02/26/2021, 10:31 PM

## 2021-02-26 NOTE — Progress Notes (Signed)
Pt tired of using breast pump, no change in cx.  Declines pitocin.  Wants to try another cytotec.  FHR Cat 1, mild and irregular ctx.  BP 113-133/61/70.  Will try another cytotec.

## 2021-02-26 NOTE — Progress Notes (Signed)
Labor Progress Note Angelica Mills is a 20 y.o. G1P0 at [redacted]w[redacted]d presented for IOL for gHTN S: [name] has no complaints at this time. She is managing her pain through breathing exercises and listening to music.   O:  BP 127/88   Pulse 87   Temp 98 F (36.7 C) (Oral)   Resp 17   Ht 5\' 8"  (1.727 m)   Wt 102.1 kg   LMP 06/03/2020   BMI 34.21 kg/m  EFM: 130/moderate/accels present  CVE: Dilation: 7 Effacement (%): 90 Cervical Position: Middle Station: -1 Presentation: Vertex Exam by:: [email or web address], RN   A&P: 20 y.o. G1P0 [redacted]w[redacted]d IOL for gHTN  #Labor: Progressing well. No intervention at this time #Pain: Does not desire epidural, pain controlled well with deep breathing #FWB: Cat I #GBS positive   [redacted]w[redacted]d, MD 9:49 AM

## 2021-02-26 NOTE — Lactation Note (Signed)
This note was copied from a baby's chart. Lactation Consultation Note  Patient Name: Angelica Mills Today's Date: 02/26/2021 Reason for consult: L&D Initial assessment Age:21 hours P1 Mother seen in L&D, Infant is 38+2 weeks, infant breastfed on and off with strong sucks. Instruct mother in cue base feeding.  Mother informed that she would get follow up when in her room.   Maternal Data    Feeding Mother's Current Feeding Choice: Breast Milk  LATCH Score Latch: Repeated attempts needed to sustain latch, nipple held in mouth throughout feeding, stimulation needed to elicit sucking reflex.  Audible Swallowing: A few with stimulation  Type of Nipple: Flat  Comfort (Breast/Nipple): Soft / non-tender  Hold (Positioning): Assistance needed to correctly position infant at breast and maintain latch.  LATCH Score: 6   Lactation Tools Discussed/Used    Interventions Interventions: Breast feeding basics reviewed;Assisted with latch;Skin to skin;Hand express;Adjust position;Support pillows;Position options;Education  Discharge    Consult Status Consult Status: Follow-up from L&D    Stevan Born [facility] 02/26/2021, 1:22 PM

## 2021-02-27 NOTE — Progress Notes (Addendum)
Post Partum Day 1 Subjective: up ad lib, voiding, tolerating PO, and + flatus. Lochia normal. Patient states she is having mild cramping with breast feeding but pain is manageable. She is still unsure on what contraceptive method she would like.   Objective: Blood pressure 109/63, pulse 92, temperature 98.5 F (36.9 C), temperature source Oral, resp. rate 16, height 5\' 8"  (1.727 m), weight 102.1 kg, last menstrual period 06/03/2020, SpO2 100 %, unknown if currently breastfeeding.  Physical Exam:  General: alert, cooperative, and no distress Lochia: appropriate Uterine Fundus: firm Incision: no incision DVT Evaluation: No evidence of DVT seen on physical exam. No cords or calf tenderness. No significant calf/ankle edema.  Recent Labs    02/25/21 1040  HGB 11.0*  HCT 35.3*    Assessment/Plan: -Patient is meeting all postpartum milestones. -Pregnancy complicated by GHTN, blood pressures have been in normal range, needs pre-discharge BP check. -Breastfeeding and unsure of what contraception method she would like.  -Plan for discharge this afternoon, if BP remains in normal range.    LOS: 2 days   Angelica Mills 02/27/2021, 8:24 AM   Midwife attestation Post Partum Day 1 I have seen and examined this patient and agree with above documentation in the medical student's note.   Angelica Mills is a 20 y.o. G1P1001 s/p SVD.  Pt denies problems with ambulating, voiding or po intake. Pain is well controlled.  Plan for birth control is  undecided .  Method of Feeding: breast  PE:  BP 109/63 (BP Location: Right Arm)   Pulse 92   Temp 98.5 F (36.9 C) (Oral)   Resp 16   Ht 5\' 8"  (1.727 m)   Wt 102.1 kg   LMP 06/03/2020   SpO2 100%   Breastfeeding Unknown   BMI 34.21 kg/m  Gen: well appearing Heart: reg rate Lungs: normal WOB Fundus firm Ext: soft, no pain, no edema  Plan for discharge: possible discharge today, no barriers for patient.  Discharge pt today,  pending infant discharge this afternoon.  , CNM 10:24 AM

## 2021-02-28 MED ORDER — ACETAMINOPHEN 325 MG PO TABS: 650.0000 mg | ORAL_TABLET | ORAL | Status: DC | PRN

## 2021-02-28 MED ORDER — IBUPROFEN 200 MG PO TABS: 600.0000 mg | ORAL_TABLET | Freq: Four times a day (QID) | ORAL | Status: DC | PRN

## 2021-02-28 NOTE — Social Work (Signed)
CSW received consult for hx of Anxiety and Depression.  CSW met with MOB to offer support and complete assessment.     CSW introduced self and role. CSW observed MGM holding infant 'Mulani' and FOB also bedside. CSW offered to return, however MOB declined and stated supports could remain in room for assessment. CSW informed MOB of reason for consult. MOB was welcoming, pleasant and engaged with CSW. MOB reported she is doing great after having infant. MOB jokingly shared she was mean during pregnancy, but she now feels like herself. MOB expressed the pregnancy was somewhat stressful, considering she is currently in school and this is her first child. CSW expressed understanding and validated MOB's feelings. MOB denies any history of mental history and reiterated she is excited now that infant is here. MOB identified FOB and both of their parents as supports. MOB denies any SI or HI.   CSW provided education regarding the baby blues period versus PPD and provided resources. CSW provided the New Mom Checklist and encouraged MOB to self evaluate and contact a medical professional if symptoms are noted at any time.   CSW provided review of Sudden Infant Death Syndrome (SIDS) precautions. MOB reported she has all infants needs, including a bassinet and car seat. MOB identified [facility] for follow-up care and denies any barriers to care. MOB expressed interest in [facility] and allowed CSW to contact [facility] and have an appointment established. MOB also receptive to a referral for [facility]. MOB declined any additional needs at this time.  CSW identifies no further need for intervention and no barriers to discharge at this time.  Darra Lis, [place] [employer] [phone]

## 2021-03-05 ENCOUNTER — Other Ambulatory Visit: Payer: Self-pay

## 2021-03-05 ENCOUNTER — Encounter: Payer: Self-pay | Admitting: *Deleted

## 2021-03-05 ENCOUNTER — Ambulatory Visit (INDEPENDENT_AMBULATORY_CARE_PROVIDER_SITE_OTHER): Payer: BC Managed Care – PPO | Admitting: *Deleted

## 2021-03-05 VITALS — BP 119/80 | HR 101 | Temp 98.0°F | Wt 214.0 lb

## 2021-03-05 DIAGNOSIS — Z0130 Encounter for examination of blood pressure without abnormal findings: Secondary | ICD-10-CM

## 2021-03-05 NOTE — Progress Notes (Signed)
   Subjective:  Angelica Mills is a 20 y.o. female here for BP check.   Hypertension ROS: no TIA's, no chest pain on exertion, no dyspnea on exertion, no swelling of ankles, no orthostatic dizziness or lightheadedness, no orthopnea or paroxysmal nocturnal dyspnea, and no palpitations.    Objective:  BP 119/80 (BP Location: Left Arm, Patient Position: Sitting, Cuff Size: Large)   Pulse (!) 101   Temp 98 F (36.7 C) (Oral)   Wt 214 lb (97.1 kg)   Breastfeeding Yes   BMI 32.54 kg/m   Appearance alert, well appearing, and in no distress and oriented to person, place, and time. General exam BP noted to be well controlled today in office.    Assessment:   Blood Pressure well controlled and asymptomatic.   Plan:  Current treatment plan is effective, no change in therapy.  Clovis Pu, RN

## 2021-03-12 ENCOUNTER — Telehealth (HOSPITAL_COMMUNITY): Payer: Self-pay | Admitting: *Deleted

## 2021-03-12 NOTE — Telephone Encounter (Signed)
Mom reports feeling well. No concerns about herself. EPDS score in hospital was 11. Today it is 7. Mom reports someone came into hospital room to ask about behavioral health appt, but she declined. Says most of the stress from her pregnancy caused high score in hospital, but baby is here and healthy so she's feeling much less stress now. Suggested mom notify physician if she feels anxiety or sadness return. Mom agrees. Mom reports baby is fine. Feeding breastmilk well. Peeing and pooping without difficulty. Sleeps in bassinet in mom's room on back. No concerns about baby.  Duffy Rhody, RN 03-12-2021 at 11:02am

## 2021-03-27 ENCOUNTER — Telehealth: Payer: Self-pay | Admitting: Certified Nurse Midwife

## 2021-03-27 ENCOUNTER — Ambulatory Visit: Payer: BC Managed Care – PPO | Admitting: Certified Nurse Midwife

## 2021-03-27 NOTE — Telephone Encounter (Signed)
Patient called and stated she would not be able to come in to the office today. She stated she has flu like symptoms.

## 2021-04-03 ENCOUNTER — Encounter: Payer: Self-pay | Admitting: Certified Nurse Midwife

## 2021-04-03 ENCOUNTER — Other Ambulatory Visit: Payer: Self-pay

## 2021-04-03 ENCOUNTER — Telehealth (INDEPENDENT_AMBULATORY_CARE_PROVIDER_SITE_OTHER): Payer: BC Managed Care – PPO | Admitting: Certified Nurse Midwife

## 2021-04-03 NOTE — Progress Notes (Signed)
Provider location: [facility]   Patient location: Home  I connected with Ms. Tata on 04/03/21 at  9:35 AM EDT by Mychart Video Encounter and verified that I am speaking with the correct person using two identifiers.       I discussed the limitations, risks, security and privacy concerns of performing an evaluation and management service virtually and the availability of in person appointments. I also discussed with the patient that there may be a patient responsible charge related to this service. The patient expressed understanding and agreed to proceed.  Post Partum Visit Note Subjective:   Angelica Mills is a 20 y.o. [id number] female who presents for a postpartum visit. She is 5 weeks postpartum following a normal spontaneous vaginal delivery.  I have fully reviewed the prenatal and intrapartum course. The delivery was at [redacted]w[redacted]d gestational weeks.  Anesthesia: none. Postpartum course has been good and uncomplicated. Baby is doing well. Baby is feeding by breast. Bleeding no bleeding. Bowel function is normal. Bladder function is normal. Patient is sexually active. Contraception method is none. Postpartum depression screening: negative.  Upstream - 04/03/21 1105       Pregnancy Intention Screening   Does the patient want to become pregnant in the next year? No    Does the patient's partner want to become pregnant in the next year? No    Would the patient like to discuss contraceptive options today? Yes      Contraception Wrap Up   Current Method Abstinence    End Method Female Condom    Contraception Counseling Provided Yes   Desires to continue research and call when ready to start hormonal birth control            Edinburgh Postnatal Depression Scale - 04/03/21 0941       Edinburgh Postnatal Depression Scale:  In the Past 7 Days   I have been able to laugh and see the funny side of things. 0    I have looked forward with enjoyment to  things. 0    I have blamed myself unnecessarily when things went wrong. 2    I have been anxious or worried for no good reason. 0    I have felt scared or panicky for no good reason. 0    Things have been getting on top of me. 0    I have been so unhappy that I have had difficulty sleeping. 0    I have felt sad or miserable. 1    I have been so unhappy that I have been crying. 1    The thought of harming myself has occurred to me. 0    Edinburgh Postnatal Depression Scale Total 4            The following portions of the patient's history were reviewed and updated as appropriate: allergies, current medications, past family history, past medical history, past social history, past surgical history, and problem list.  Review of Systems Pertinent items noted in HPI and remainder of comprehensive ROS otherwise negative.  Objective:  BP 115/70 (BP Location: Right Arm, Patient Position: Sitting, Cuff Size: Normal)   Pulse 88   Ht 5\' 8"  (1.727 m)   Wt 215 lb (97.5 kg)   Breastfeeding Yes   BMI 32.69 kg/m     General:  Alert, oriented and cooperative. Patient is in no acute distress.  Respiratory: Normal respiratory effort, no problems with respiration noted  Mental Status: Normal mood  and affect. Normal behavior. Normal judgment and thought content.  Rest of physical exam deferred due to type of encounter   Assessment:  Postpartum care of lactating mother  Plan:  Essential components of care per ACOG recommendations:  1.  Mood and well being: Patient with negative depression screening today. Reviewed local resources for support.  - Patient does not use tobacco.  - hx of drug use? No    2. Infant care and feeding:  -Patient currently breastmilk feeding? Yes Reviewed importance of draining breast regularly to support lactation. -Social determinants of health (SDOH) reviewed in EPIC. No concerns  3. Sexuality, contraception and birth spacing - Patient does not want a pregnancy in  the next year.   - Reviewed forms of contraception in tiered fashion. Patient desired  to keep researching her options  today.   - Discussed birth spacing of 18 months  4. Sleep and fatigue -Encouraged family/partner/community support of 4 hrs of uninterrupted sleep to help with mood and fatigue  5. Physical Recovery  - Discussed patients delivery and complications - Patient had no laceration, perineal healing reviewed. Patient expressed understanding - Patient has urinary incontinence? No - Patient is safe to resume physical and sexual activity, encouraged to use a barrier method if she does not desire pregnancy  6.  Health Maintenance - Last pap smear done 02/15/21 and was normal with negative HPV. - Mammogram not indicated  7. No Chronic Disease - PCP follow up as needed  I provided 10 minutes of face-to-face time during this encounter.  Follow up for annual exam or if/when she chooses a birth control method.  Bernerd Limbo, CNM [facility], [facility], [facility], [facility]

## 2021-04-03 NOTE — Patient Instructions (Signed)
Slynd progestin-only pills for birth control.  Look up [employer] support group.

## 2021-08-23 ENCOUNTER — Ambulatory Visit
Admission: EM | Admit: 2021-08-23 | Discharge: 2021-08-23 | Disposition: A | Discharge status: Home / Self Care | Payer: BC Managed Care – PPO | Attending: Physician Assistant | Admitting: Physician Assistant

## 2021-08-23 ENCOUNTER — Other Ambulatory Visit: Payer: Self-pay

## 2021-08-23 DIAGNOSIS — J029 Acute pharyngitis, unspecified: Secondary | ICD-10-CM

## 2021-08-23 DIAGNOSIS — Z3202 Encounter for pregnancy test, result negative: Secondary | ICD-10-CM | POA: Diagnosis not present

## 2021-08-23 DIAGNOSIS — Z113 Encounter for screening for infections with a predominantly sexual mode of transmission: Secondary | ICD-10-CM | POA: Diagnosis not present

## 2021-08-23 LAB — POCT RAPID STREP A (OFFICE): Rapid Strep A Screen: NEGATIVE

## 2021-08-23 LAB — POCT URINE PREGNANCY: Preg Test, Ur: NEGATIVE

## 2021-08-23 NOTE — ED Triage Notes (Addendum)
Pt reports an onset of sore throat last week that resolved spontaneously before returning two days ago. Has been drinking tea and using lozenges with some relief. No v/d. Notes some cough and dysphagia.  Pt is requesting preg and STD testing. Denies vaginal sxs.

## 2021-08-23 NOTE — ED Provider Notes (Signed)
[facility]    CSN: [id number] Arrival date & time: 08/23/21  0803      History   Chief Complaint Chief Complaint  Patient presents with   Sore Throat    HPI Angelica Mills is a 21 y.o. female.   Patient here today for evaluation of sore throat that has been waxing and waning over the last several days.  She reports that she had significant sore throat and then symptoms resolved until about 2 days ago when sore throat returned.  She has had minimal cough and mild congestion.  She states she took a COVID test at home that was negative.  She was concerned because the symptoms presented with her last pregnancy.  She has not had fever but did have some night sweats last night.  She has been drinking tea and using throat lozenges with mild relief.  She denies any abdominal pain.  She also requests STD screening.  She denies any current symptoms.  The history is provided by the patient.   Past Medical History:  Diagnosis Date   Gestational hypertension affecting first pregnancy 02/25/2021   Medical history non-contributory    Supervision of high risk pregnancy, antepartum 08/22/2020    Nursing Staff Provider Office Location  [facility] Dating  LMP Language  English Anatomy US  Normal Flu Vaccine   Genetic Screen  NIPS: Low risk girl  AFP: Negative  First Screen:  Quad:   TDaP Vaccine    Hgb A1C or  GTT Early 5.1>>3rd trimester 5.5 Third trimester DECLINED COVID Vaccine 2 doses   LAB RESULTS  Rhogam  N/A Blood Type O/Positive/-- (02/16 1027)  Feeding Plan Breast  Antibody Negat    Patient Active Problem List   Diagnosis Date Noted   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 Authorizing Provider  Prenatal Vit-Fe Fumarate-FA (PRENATAL VITAMINS PO) Take 2 tablets by mouth daily at 6 (six) AM.    [provider]    Family History Family History  Problem Relation Age of Onset   Hypertension Father     Social History Social History   Tobacco Use   Smoking status: Never   Smokeless tobacco: Never  Vaping Use   Vaping Use: Never used  Substance Use Topics   Alcohol use: Not Currently    Comment: rare   Drug use: Never     Allergies   Patient has no known allergies.   Review of Systems Review of Systems  Constitutional:  Positive for diaphoresis. Negative for chills and fever.  HENT:  Positive for congestion and sore throat. Negative for ear pain.   Eyes:  Negative for discharge and redness.  Respiratory:  Positive for cough. Negative for shortness of breath and wheezing.   Gastrointestinal:  Negative for abdominal pain, diarrhea, nausea and vomiting.    Physical  Exam Triage Vital Signs ED Triage Vitals  Enc Vitals Group     BP      Pulse      Resp      Temp      Temp src      SpO2      Weight      Height      Head Circumference      Peak Flow      Pain Score      Pain Loc      Pain Edu?      Excl. in [place]?    No data found.  Updated Vital Signs BP 131/84 (BP Location: Left Arm)    Pulse (!) 102    Temp 98.5 F (36.9 C) (Oral)    Resp 18    LMP 08/02/2021 (Approximate)    SpO2 97%    Breastfeeding Yes      Physical Exam Vitals and nursing note reviewed.  Constitutional:      General: She is not in acute distress.    Appearance: Normal appearance. She is not ill-appearing.  HENT:     Head: Normocephalic and atraumatic.     Nose: No congestion or rhinorrhea.     Mouth/Throat:     Mouth: Mucous membranes are moist.     Pharynx: Posterior oropharyngeal erythema present. No oropharyngeal exudate.  Eyes:     Conjunctiva/sclera: Conjunctivae normal.   Cardiovascular:     Rate and Rhythm: Normal rate and regular rhythm.     Heart sounds: Normal heart sounds. No murmur heard. Pulmonary:     Effort: Pulmonary effort is normal. No respiratory distress.     Breath sounds: Normal breath sounds. No wheezing, rhonchi or rales.  Skin:    General: Skin is warm and dry.  Neurological:     Mental Status: She is alert.  Psychiatric:        Mood and Affect: Mood normal.        Thought Content: Thought content normal.     UC Treatments / Results  Labs (all labs ordered are listed, but only abnormal results are displayed) Labs Reviewed  POCT URINE PREGNANCY  POCT RAPID STREP A (OFFICE)    EKG   Radiology No results found.  Procedures Procedures (including critical care time)  Medications Ordered in UC Medications - No data to display  Initial Impression / Assessment and Plan / UC Course  I have reviewed the triage vital signs and the nursing notes.  Pertinent labs & imaging results that were available during my care of the patient were reviewed by me and considered in my medical decision making (see chart for details).    Strep test negative in office.  Will order throat culture for further evaluation.  Suspect likely viral etiology of symptoms and discussing with patient.  Urgency test negative in office.  Will order STD screening as requested.  Patient declines blood tests at this time.  Final Clinical Impressions(s) / UC Diagnoses   Final diagnoses:  Acute pharyngitis, unspecified etiology  Screening for STD (sexually transmitted disease)   Discharge Instructions   None    ED Prescriptions   None    PDMP not reviewed this encounter.   Francene Finders, PA-C 08/23/21 1034

## 2021-08-26 LAB — CULTURE, GROUP A STREP (THRC)

## 2021-10-08 IMAGING — US US MFM OB LIMITED
1 series · 15 of 19 positions shown · non-contrast
Comparison: none

[Series 1: us mfm ob limited · 15 of 19 slices shown]
[im 1/19]
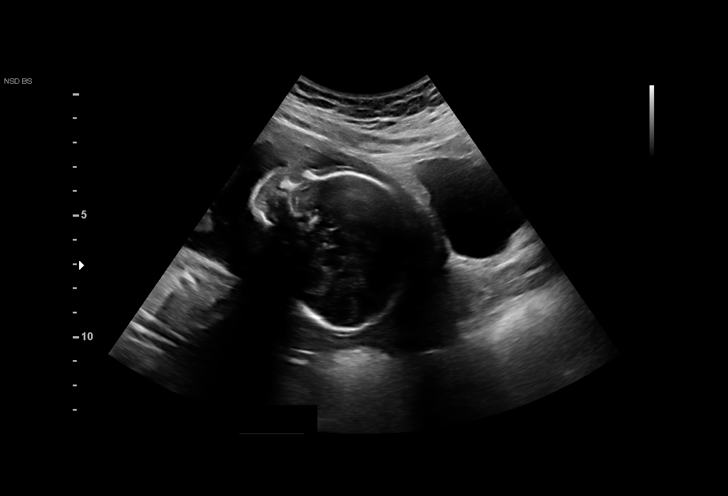
[im 2/19]
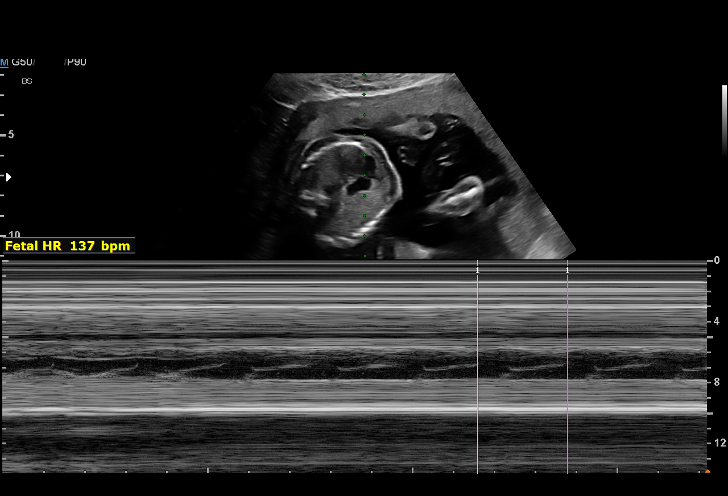
[im 4/19]
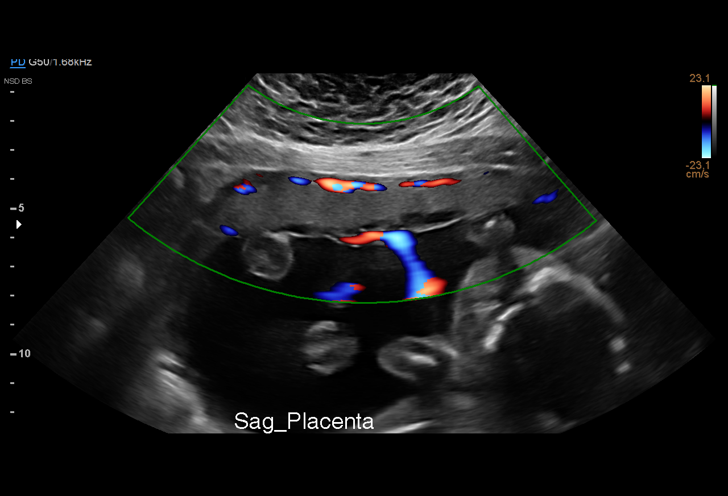
[im 5/19]
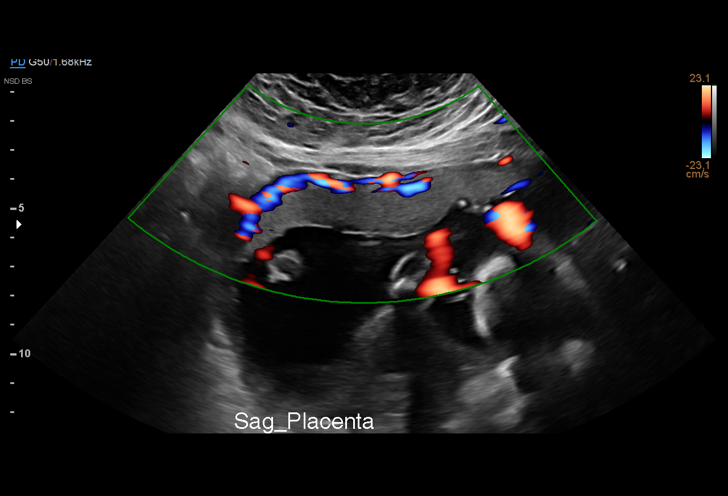
[im 6/19]
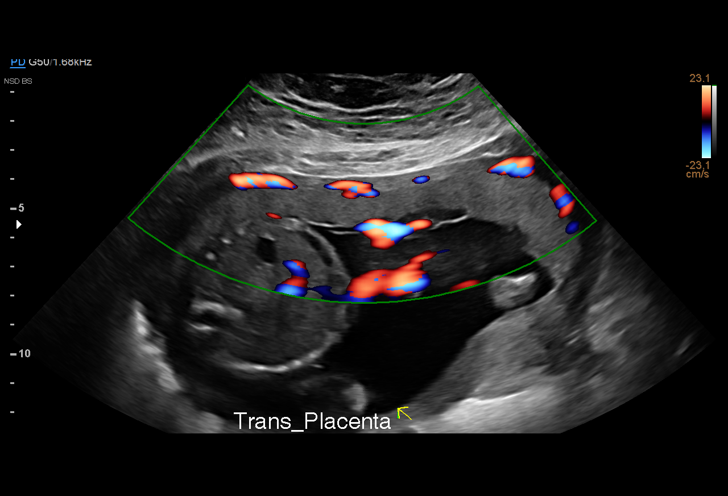
[im 7/19]
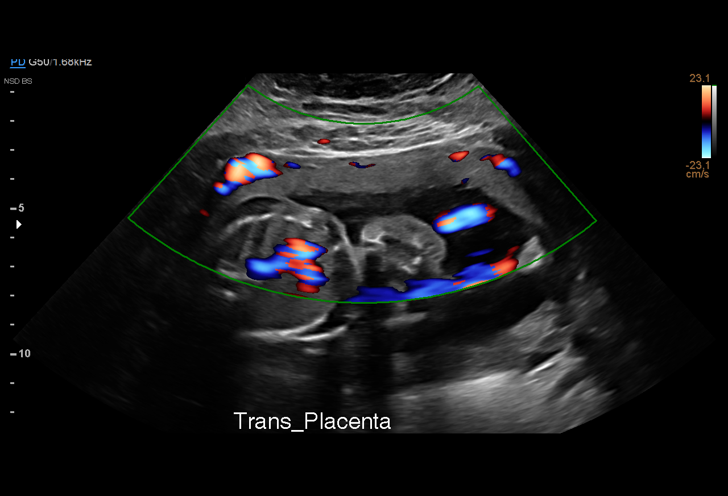
[im 9/19]
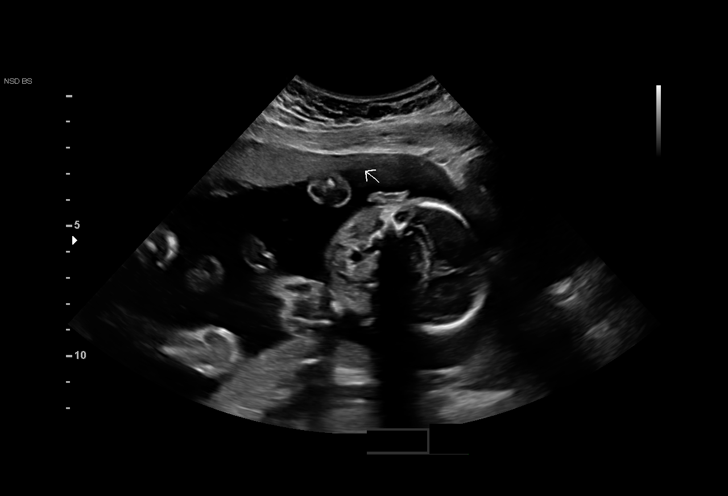
[im 10/19]
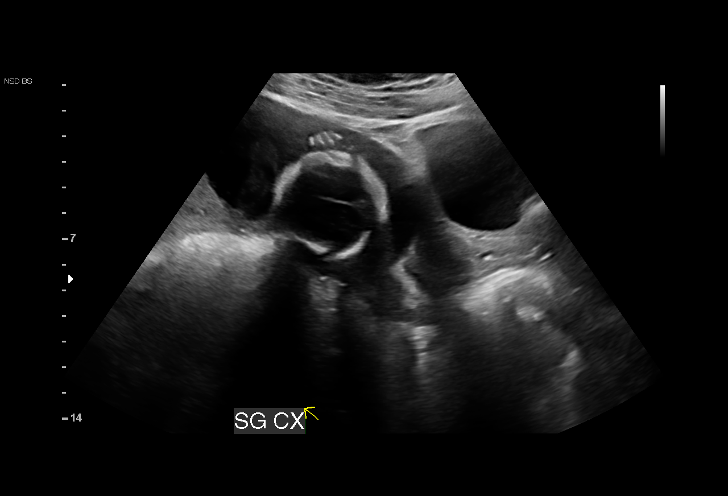
[im 11/19]
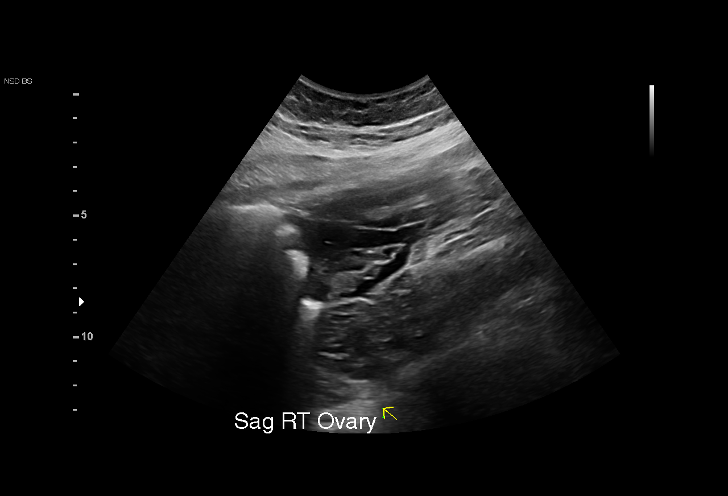
[im 13/19]
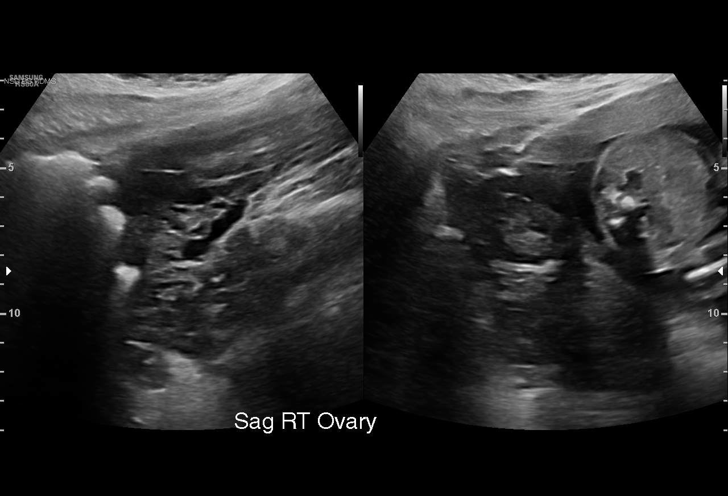
[im 14/19]
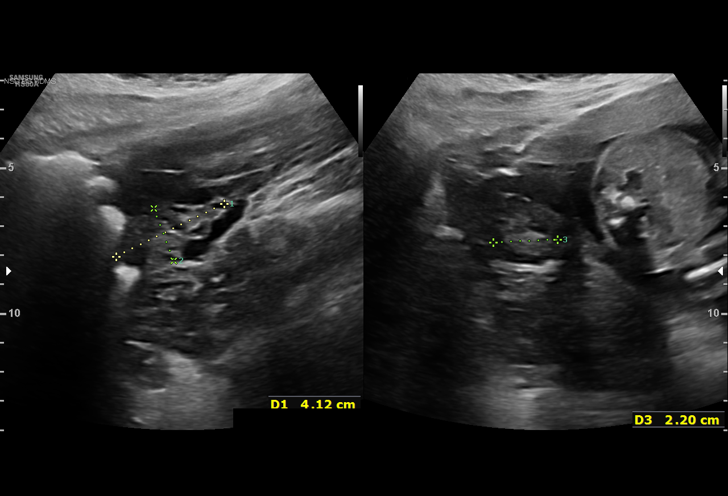
[im 15/19]
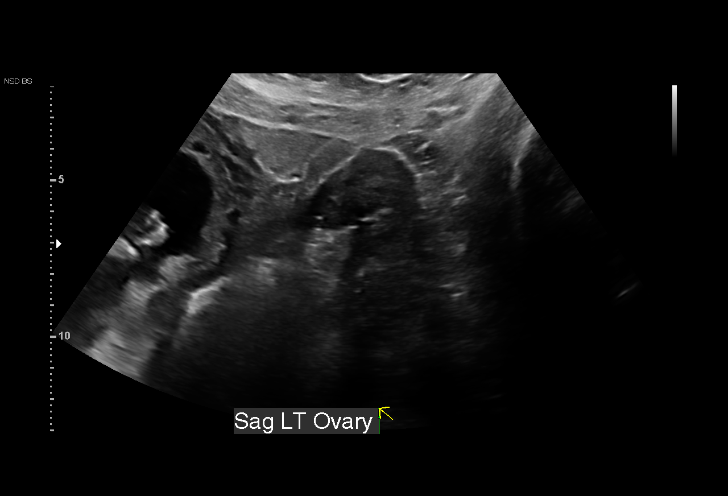
[im 16/19]
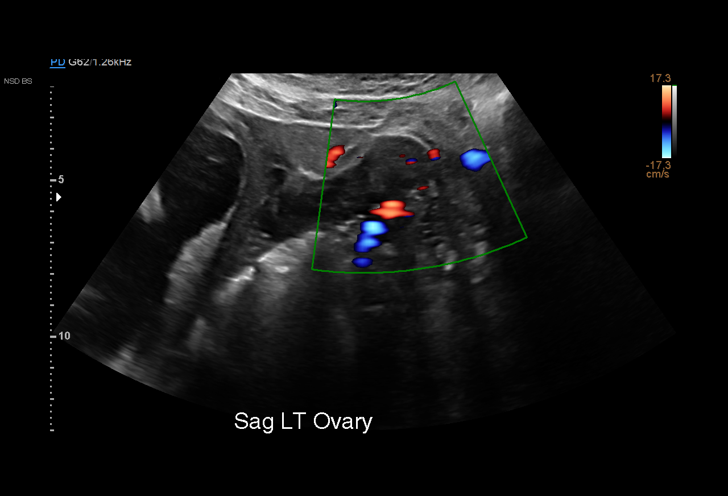
[im 18/19]
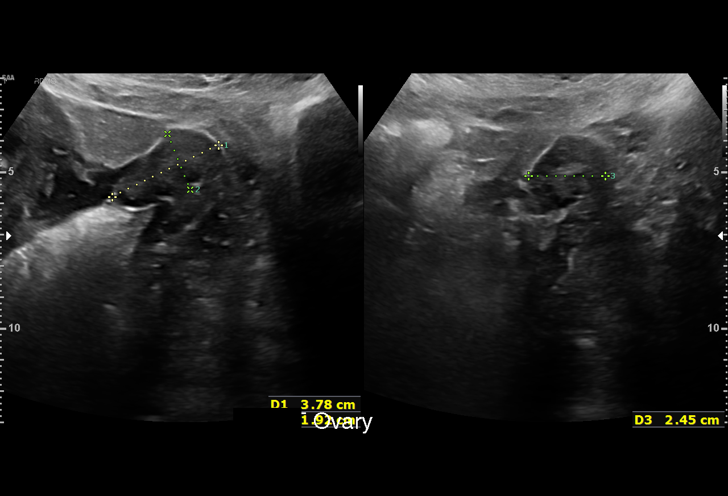
[im 19/19]
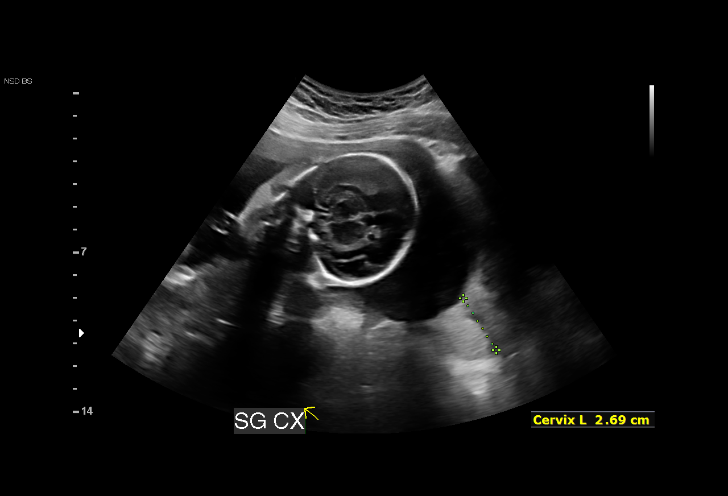

[15 of 19 positions shown; findings below may reference images not displayed]

[facility] [HOSPITAL]
 Referred By:      NEEM MOSLIM          [facility]
                   [facility]                              [HOSPITAL]

 1  US MFM OB LIMITED                     [id number]    ANNSOFI IDBERG

Indications

 Vaginal bleeding in pregnancy, second
 trimester
 23 weeks gestation of pregnancy
Fetal Evaluation

 Num Of Fetuses:          1
 Fetal Heart Rate(bpm):   137
 Cardiac Activity:        Observed
 Presentation:            Cephalic
 Placenta:                Anterior
 P. Cord Insertion:       Visualized, central

 Amniotic Fluid
 AFI FV:      Within normal limits

                             Largest Pocket(cm)
                             7.

 Comment:    No placental abruption or previa identified by ultrasound.
OB History

 Gravidity:    1         Term:   0        Prem:   0        SAB:   0
 TOP:          0       Ectopic:  0        Living: 0
Gestational Age

 LMP:           23w 0d        Date:  06/03/20                 EDD:   03/10/21
 Best:          23w 0d     Det. By:  LMP  (06/03/20)          EDD:   03/10/21
Cervix Uterus Adnexa

 Cervix
 Length:            2.7  cm.
 Normal appearance by transabdominal scan.

 Right Ovary
 Within normal limits.

 Left Ovary
 Within normal limits.

 Adnexa
 No abnormality visualized.
Impression

 Limited exam to assess vaginal bleeding and uncertain dates
 There is good fetal movement and amniotic fluid
 There is no evidence of placental previa or placenta previa
Recommendations

 Clinical correlation recommended.
